# Patient Record
Sex: Male | Born: 1990 | Hispanic: No | Marital: Single | Smoking: Current every day smoker
Health system: Southern US, Community
[De-identification: ages and names within clinical notes are randomized; demographics above are authoritative.]

## PROBLEM LIST (undated history)

## (undated) DIAGNOSIS — D649 Anemia, unspecified: Secondary | ICD-10-CM

## (undated) HISTORY — PX: NO PAST SURGERIES: SHX2092

---

## 2012-05-08 ENCOUNTER — Ambulatory Visit (INDEPENDENT_AMBULATORY_CARE_PROVIDER_SITE_OTHER): Payer: Managed Care, Other (non HMO) | Admitting: Family Medicine

## 2012-05-08 VITALS — BP 90/57 | HR 86 | Temp 98.5°F | Resp 16 | Ht 73.0 in | Wt 128.0 lb

## 2012-05-08 DIAGNOSIS — R059 Cough, unspecified: Secondary | ICD-10-CM

## 2012-05-08 DIAGNOSIS — B9789 Other viral agents as the cause of diseases classified elsewhere: Secondary | ICD-10-CM

## 2012-05-08 DIAGNOSIS — B349 Viral infection, unspecified: Secondary | ICD-10-CM

## 2012-05-08 DIAGNOSIS — R05 Cough: Secondary | ICD-10-CM

## 2012-05-08 DIAGNOSIS — K2970 Gastritis, unspecified, without bleeding: Secondary | ICD-10-CM

## 2012-05-08 LAB — POCT CBC
Granulocyte percent: 67.9 %G (ref 37–80)
MCH, POC: 27.9 pg (ref 27–31.2)
MCV: 87.1 fL (ref 80–97)
MID (cbc): 0.4 (ref 0–0.9)
MPV: 9.4 fL (ref 0–99.8)
POC LYMPH PERCENT: 22.9 %L (ref 10–50)
POC MID %: 9.2 %M (ref 0–12)
Platelet Count, POC: 269 10*3/uL (ref 142–424)
RBC: 5.23 M/uL (ref 4.69–6.13)
RDW, POC: 13.4 %

## 2012-05-08 MED ORDER — BENZONATATE 100 MG PO CAPS: ORAL_CAPSULE | ORAL | Status: DC

## 2012-05-08 MED ORDER — OMEPRAZOLE 40 MG PO CPDR: 40.0000 mg | DELAYED_RELEASE_CAPSULE | Freq: Every day | ORAL | Status: DC

## 2012-05-08 NOTE — Patient Instructions (Addendum)
Take the omeprazole one daily for reducing stomach acid  Take the benzonatate one or 2 tablets 3 times daily as needed for cough  Get some over-the-counter  Claritin D (loratadine D) and take one daily for her head congestion  Drink plenty of water  Avoid spicy, fried or fatty foods into your stomach calms down  Return if needed

## 2012-05-08 NOTE — Progress Notes (Signed)
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 He has had pain in the epigastrium and up into his substernal region. He vomited one time yesterday. He also has developed head congestion and rhinorrhea. He does smoke almost half pack a day. He is generally a healthy young man however.  Objective: TMs are normal. Throat not erythematous. Neck supple without significant nodes. Nose is not terribly congested right now. His chest is clear to auscultation. Heart regular without murmurs gallops or arrhythmias. He does have a cough.  Assessment: Probable viral syndrome with gastroenteritis and URI  Plan: CBC  Results for orders placed in visit on 05/08/12  POCT CBC      Result Value Range   WBC 4.7  4.6 - 10.2 K/uL   Lymph, poc 1.1  0.6 - 3.4   POC LYMPH PERCENT 22.9  10 - 50 %L   MID (cbc) 0.4  0 - 0.9   POC MID % 9.2  0 - 12 %M   POC Granulocyte 3.2  2 - 6.9   Granulocyte percent 67.9  37 - 80 %G   RBC 5.23  4.69 - 6.13 M/uL   Hemoglobin 14.6  14.1 - 18.1 g/dL   HCT, POC 16.1  09.6 - 53.7 %   MCV 87.1  80 - 97 fL   MCH, POC 27.9  27 - 31.2 pg   MCHC 32.0  31.8 - 35.4 g/dL   RDW, POC 04.5     Platelet Count, POC 269  142 - 424 K/uL   MPV 9.4  0 - 99.8 fL   Treat as a viral syndrome with omeprazole for the stomach, Tessalon for the cough, and loratadine for the drainage.

## 2012-06-12 ENCOUNTER — Ambulatory Visit (INDEPENDENT_AMBULATORY_CARE_PROVIDER_SITE_OTHER): Payer: Managed Care, Other (non HMO) | Admitting: Internal Medicine

## 2012-06-12 ENCOUNTER — Ambulatory Visit: Payer: Managed Care, Other (non HMO)

## 2012-06-12 VITALS — BP 100/57 | HR 89 | Temp 98.0°F | Resp 16 | Ht 68.0 in | Wt 125.8 lb

## 2012-06-12 DIAGNOSIS — M25569 Pain in unspecified knee: Secondary | ICD-10-CM

## 2012-06-12 DIAGNOSIS — M25562 Pain in left knee: Secondary | ICD-10-CM

## 2012-06-12 DIAGNOSIS — S86912A Strain of unspecified muscle(s) and tendon(s) at lower leg level, left leg, initial encounter: Secondary | ICD-10-CM

## 2012-06-12 DIAGNOSIS — IMO0002 Reserved for concepts with insufficient information to code with codable children: Secondary | ICD-10-CM

## 2012-06-12 MED ORDER — IBUPROFEN 600 MG PO TABS: 600.0000 mg | ORAL_TABLET | Freq: Three times a day (TID) | ORAL | Status: DC | PRN

## 2012-06-12 NOTE — Progress Notes (Signed)
  Subjective:    Patient ID: Calvin Wolf, male    DOB: 21-Aug-1990, 22 y.o.   MRN: [id number]  HPI Pt c/o of knee pain started yesterday after playing soccer and someone hit him in the knee. Blow struck lateral knee and has pain laterally Pain is lateral joint line. No swelling and no previous knee injury.   Review of Systems     Objective:   Physical Exam  Constitutional: He is oriented to person, place, and time. He appears well-developed and well-nourished.  Eyes: EOM are normal.  Pulmonary/Chest: Effort normal.  Musculoskeletal: Normal range of motion. He exhibits tenderness. He exhibits no edema.       Left knee: He exhibits bony tenderness. He exhibits normal range of motion, no swelling, no effusion, no ecchymosis, no deformity, no erythema, normal alignment, no LCL laxity, normal patellar mobility, normal meniscus and no MCL laxity. Tenderness found. Lateral joint line tenderness noted. No medial joint line and no patellar tendon tenderness noted.  Neurological: He is alert and oriented to person, place, and time. He has normal strength. No cranial nerve deficit or sensory deficit. He exhibits normal muscle tone. Coordination and gait abnormal.  Psychiatric: He has a normal mood and affect. His behavior is normal.     [facility] reading (PRIMARY) by  Dr Perrin Maltese normal xr       Assessment & Plan:  Sprain knee Hinged knee brace/RICE/Motrin 600mg /Crutches RTC 1 week

## 2012-06-12 NOTE — Patient Instructions (Signed)
Knee, Cartilage (Meniscus) Injury It is suspected that you have a torn cartilage (meniscus) in your knee. The menisci are made of tough cartilage, and fit between the surfaces of the thigh and leg bones. The menisci are "C"shaped and have a wedged profile. The wedged profile helps the stability of the joint by keeping the rounded femur surface from sliding off the flat tibial surface. The menisci are fed (nourished) by small blood vessels; but there is also a large area at the inner edge of the meniscus that does not have a good blood supply (avascular). This presents a problem when there is an injury to the meniscus, because areas without good blood supply heal poorly. As a result when there is a torn cartilage in the knee, surgery is often required to fix it. This is usually done with a surgical procedure less invasive than open surgery (arthroscopy). Some times open surgery of the knee is required if there is other damage. PURPOSE OF THE MENISCUS The medial meniscus rests on the medial tibial plateau. The tibia is the large bone in your lower leg (the shin bone). The medial tibial plateau is the upper end of the bone making up the inner part of your knee. The lateral meniscus serves the same purpose and is located on the outside of the knee. The menisci help to distribute your body weight across the knee joint; they act as shock absorbers. Without the meniscus present, the weight of your body would be unevenly applied to the bones in your legs (the femur and tibia). The femur is the large bone in your thigh. This uneven weight distribution would cause increased wear and tear on the cartilage lining the joint surfaces, leading to early damage (arthritis) of these areas. The presence of the menisci cartilage is necessary for a healthy knee. PURPOSE OF THE KNEE CARTILAGE The knee joint is made up of three bones: the thigh bone (femur), the shin bone (tibia), and the knee cap (patella). The surfaces of these  bones at the knee joint are covered with cartilage called articular cartilage. This smooth slippery surface allows the bones to slide against each other without causing bone damage. The meniscus sits between these cartilaginous surfaces of the bones. It distributes the weight evenly in the joints and helps with the stability of the joint (keeps the joint steady). HOME CARE INSTRUCTIONS  Use crutches and external braces as instructed.  Once home an ice pack applied to your injured knee may help with discomfort and keep the swelling down. An ice pack can be used for the first couple of days or as instructed.  Only take over-the-counter or prescription medicines for pain, discomfort, or fever as directed by your caregiver.  Call if you do not have relief of pain with medications or if there is increasing in pain.  Call if your foot becomes cold or blue.  You may resume normal diet and activities as directed.  Make sure to keep your appointment with your follow-up caregiver. This injury may require further evaluation and treatment beyond the temporary treatment given today. Document Released: 03/14/2002 Document Revised: 03/16/2011 Document Reviewed: 07/06/2008 Exit[employer] Patient Information 2014 [employer].  

## 2012-06-12 NOTE — Progress Notes (Signed)
  Subjective:    Patient ID: Calvin Wolf, male    DOB: 1990/06/02, 22 y.o.   MRN: [id number]  HPI    Review of Systems     Objective:   Physical Exam        Assessment & Plan:

## 2012-07-07 ENCOUNTER — Ambulatory Visit (INDEPENDENT_AMBULATORY_CARE_PROVIDER_SITE_OTHER): Payer: Managed Care, Other (non HMO) | Admitting: Internal Medicine

## 2012-07-07 VITALS — BP 110/60 | HR 64 | Temp 98.0°F | Resp 16 | Ht 68.5 in | Wt 128.0 lb

## 2012-07-07 DIAGNOSIS — S81002D Unspecified open wound, left knee, subsequent encounter: Secondary | ICD-10-CM

## 2012-07-07 DIAGNOSIS — IMO0002 Reserved for concepts with insufficient information to code with codable children: Secondary | ICD-10-CM

## 2012-07-07 DIAGNOSIS — M25569 Pain in unspecified knee: Secondary | ICD-10-CM

## 2012-07-07 NOTE — Progress Notes (Signed)
  Subjective:    Patient ID: Calvin Wolf, male    DOB: [birth year], 22 y.o.   MRN: [id number]  HPI  Left without being seen  Review of Systems     Objective:   Physical Exam        Assessment & Plan:  No visit

## 2012-07-11 ENCOUNTER — Inpatient Hospital Stay (HOSPITAL_COMMUNITY): Payer: Managed Care, Other (non HMO)

## 2012-07-11 ENCOUNTER — Encounter (HOSPITAL_COMMUNITY): Payer: Self-pay | Admitting: *Deleted

## 2012-07-11 ENCOUNTER — Other Ambulatory Visit: Payer: Self-pay | Admitting: Oncology

## 2012-07-11 ENCOUNTER — Inpatient Hospital Stay (HOSPITAL_COMMUNITY)
Admission: AD | Admit: 2012-07-11 | Discharge: 2012-07-16 | DRG: 804 | Disposition: A | Discharge status: Home / Self Care | Payer: Managed Care, Other (non HMO) | Source: Ambulatory Visit | Attending: Internal Medicine | Admitting: Internal Medicine

## 2012-07-11 ENCOUNTER — Ambulatory Visit (INDEPENDENT_AMBULATORY_CARE_PROVIDER_SITE_OTHER): Payer: Managed Care, Other (non HMO) | Admitting: Family Medicine

## 2012-07-11 VITALS — BP 90/55 | HR 99 | Temp 101.0°F | Resp 16 | Ht 68.0 in | Wt 124.0 lb

## 2012-07-11 DIAGNOSIS — Z609 Problem related to social environment, unspecified: Secondary | ICD-10-CM

## 2012-07-11 DIAGNOSIS — R509 Fever, unspecified: Secondary | ICD-10-CM

## 2012-07-11 DIAGNOSIS — D709 Neutropenia, unspecified: Secondary | ICD-10-CM | POA: Diagnosis present

## 2012-07-11 DIAGNOSIS — R590 Localized enlarged lymph nodes: Secondary | ICD-10-CM

## 2012-07-11 DIAGNOSIS — R221 Localized swelling, mass and lump, neck: Secondary | ICD-10-CM | POA: Diagnosis present

## 2012-07-11 DIAGNOSIS — Z603 Acculturation difficulty: Secondary | ICD-10-CM

## 2012-07-11 DIAGNOSIS — D6959 Other secondary thrombocytopenia: Secondary | ICD-10-CM | POA: Diagnosis present

## 2012-07-11 DIAGNOSIS — D72819 Decreased white blood cell count, unspecified: Secondary | ICD-10-CM

## 2012-07-11 DIAGNOSIS — I959 Hypotension, unspecified: Secondary | ICD-10-CM

## 2012-07-11 DIAGNOSIS — R220 Localized swelling, mass and lump, head: Secondary | ICD-10-CM | POA: Diagnosis present

## 2012-07-11 DIAGNOSIS — I889 Nonspecific lymphadenitis, unspecified: Principal | ICD-10-CM | POA: Diagnosis present

## 2012-07-11 DIAGNOSIS — R5081 Fever presenting with conditions classified elsewhere: Secondary | ICD-10-CM | POA: Diagnosis present

## 2012-07-11 DIAGNOSIS — R599 Enlarged lymph nodes, unspecified: Secondary | ICD-10-CM

## 2012-07-11 HISTORY — DX: Anemia, unspecified: D64.9

## 2012-07-11 LAB — COMPREHENSIVE METABOLIC PANEL
ALT: 16 U/L (ref 0–53)
AST: 24 U/L (ref 0–37)
Alkaline Phosphatase: 70 U/L (ref 39–117)
CO2: 29 mEq/L (ref 19–32)
Calcium: 9.1 mg/dL (ref 8.4–10.5)
GFR calc Af Amer: >90 mL/min (ref >90)
Glucose, Bld: 81 mg/dL (ref 70–99)
Potassium: 3.8 mEq/L (ref 3.5–5.1)
Sodium: 135 mEq/L (ref 135–145)
Total Protein: 7.2 g/dL (ref 6.0–8.3)

## 2012-07-11 LAB — CBC WITH DIFFERENTIAL/PLATELET
Basophils Absolute: 0 10*3/uL (ref 0.0–0.1)
Eosinophils Relative: 1 % (ref 0–5)
HCT: 40.3 % (ref 39.0–52.0)
Hemoglobin: 13.5 g/dL (ref 13.0–17.0)
Lymphocytes Relative: 44 % (ref 12–46)
Lymphs Abs: 0.7 10*3/uL (ref 0.7–4.0)
MCV: 83.6 fL (ref 78.0–100.0)
Monocytes Relative: 16 % — ABNORMAL HIGH (ref 3–12)
Neutro Abs: 0.5 10*3/uL — ABNORMAL LOW (ref 1.7–7.7)
RBC: 4.82 MIL/uL (ref 4.22–5.81)
WBC: 1.4 10*3/uL — CL (ref 4.0–10.5)

## 2012-07-11 LAB — URINALYSIS, ROUTINE W REFLEX MICROSCOPIC
Glucose, UA: NEGATIVE mg/dL
Hgb urine dipstick: NEGATIVE
Leukocytes, UA: NEGATIVE
Specific Gravity, Urine: 1.026 (ref 1.005–1.030)
pH: 5.5 (ref 5.0–8.0)

## 2012-07-11 LAB — POCT CBC
Granulocyte percent: 51.6 %G (ref 37–80)
HCT, POC: 45.8 % (ref 43.5–53.7)
Hemoglobin: 14.5 g/dL (ref 14.1–18.1)
MCV: 90 fL (ref 80–97)
POC Granulocyte: 0.9 — AB (ref 2–6.9)

## 2012-07-11 LAB — LACTIC ACID, PLASMA: Lactic Acid, Venous: 0.8 mmol/L (ref 0.5–2.2)

## 2012-07-11 LAB — SAVE SMEAR

## 2012-07-11 LAB — MONONUCLEOSIS SCREEN: Mono Screen: NEGATIVE

## 2012-07-11 MED ORDER — SODIUM CHLORIDE 0.9 % IV SOLN
INTRAVENOUS | Status: DC
  Administered 2012-07-11: 22:00:00 via INTRAVENOUS

## 2012-07-11 MED ORDER — IOHEXOL 300 MG/ML  SOLN
50.0000 mL | Freq: Once | INTRAMUSCULAR | Status: AC | PRN
  Administered 2012-07-11: 50 mL via ORAL

## 2012-07-11 MED ORDER — ACETAMINOPHEN 325 MG PO TABS: 650.0000 mg | ORAL_TABLET | Freq: Four times a day (QID) | ORAL | Status: DC | PRN

## 2012-07-11 MED ORDER — ACETAMINOPHEN 650 MG RE SUPP: 650.0000 mg | Freq: Four times a day (QID) | RECTAL | Status: DC | PRN

## 2012-07-11 MED ORDER — VANCOMYCIN HCL IN DEXTROSE 750-5 MG/150ML-% IV SOLN
750.0000 mg | Freq: Three times a day (TID) | INTRAVENOUS | Status: DC
  Administered 2012-07-11 – 2012-07-13 (×5): 750 mg via INTRAVENOUS
  Filled 2012-07-11 (×6): qty 150

## 2012-07-11 MED ORDER — IOHEXOL 300 MG/ML  SOLN
100.0000 mL | Freq: Once | INTRAMUSCULAR | Status: AC | PRN
  Administered 2012-07-11: 100 mL via INTRAVENOUS

## 2012-07-11 MED ORDER — PIPERACILLIN-TAZOBACTAM 3.375 G IVPB
3.3750 g | Freq: Three times a day (TID) | INTRAVENOUS | Status: DC
  Administered 2012-07-11 – 2012-07-15 (×13): 3.375 g via INTRAVENOUS
  Filled 2012-07-11 (×16): qty 50

## 2012-07-11 NOTE — Progress Notes (Signed)
I was called today by Dr. Dallas Schimke regarding this young man presenting with fever, cervical adenopathy, and mild neutropenia. I suggested admission for evaluation of fever and neutropenia and possible malignancy. We will be glad to consult.

## 2012-07-11 NOTE — H&P (Signed)
  Chief Complaint:  Neck pain  HPI: 22 yo healthy male [name] for about one year lives with 3 other roommates presented to outpatient pcp today for mass to right neck for about 3 weeks.  States the mass started 3 weeks ago, was big when he first noticed it but over the last several days it has become more painful.  No drainage from lump but is quite painful to touch.  Had chills yesterday and noted to be febrile today in office with wbc of 1.7.  Patient denies any n /v/d.  No rashes.  No sore throat.  No recent uri or other gi illnesses.  No dysuria,  No penile rashes or discharge.  His last sexual activity was about 2 months ago.  No weight loss.  No cp, no sob, no cough.  No le edema or swelling.  No abd pain.  Denies ivda.  Review of Systems:  Positive and negative as per HPI otherwise all other systems are negative  Past Medical History: Past Medical History  Diagnosis Date  . Anemia age 8    Pt states "I had before anemia"   Past Surgical History  Procedure Laterality Date  . No past surgeries      Medications: Prior to Admission medications   Not on File  none  Allergies:  No Known Allergies  Social History:  reports that he has been smoking.  He has never used smokeless tobacco. He reports that  drinks alcohol. He reports that he does not use illicit drugs.  Last sexual activity 2 months ago.  Family History: History reviewed. No pertinent family history.  Physical Exam: There were no vitals filed for this visit. pending General appearance: alert, cooperative and no distress Head: Normocephalic, without obvious abnormality, atraumatic Eyes: negative Nose: Nares normal. Septum midline. Mucosa normal. No drainage or sinus tenderness. Neck: no JVD, supple, symmetrical, trachea midline and right posterior cervical lad 3cm at least painful to touch, no induration or flunctuance mobile Lungs: clear to auscultation bilaterally Heart: regular rate and  rhythm, S1, S2 normal, no murmur, click, rub or gallop Abdomen: soft, non-tender; bowel sounds normal; no masses,  no organomegaly Extremities: extremities normal, atraumatic, no cyanosis or edema Pulses: 2+ and symmetric Skin: Skin color, texture, turgor normal. No rashes or lesions Neurologic: Grossly normal   Labs on Admission:  pending    Recent Labs  07/11/12 1610  WBC 1.7*  HGB 14.5  HCT 45.8  MCV 90.0   Radiological Exams on Admission: Pending  Assessment/Plan  22 yo male with neutropenic fever and cervical lymphadenopathy Principal Problem:   Neutropenic fever Active Problems:   Lump in neck   Immigrant with language difficulty  Place on neutropenic precautions.  Oncology consulted and have spoken to them about case.  Ordered peripheral blood smear for their review in the lab tonight.  Order cmp, cbc with diff, hiv, lactic acid, ldh, mono screen, cxr, ua and ct scan of neck to evaluate mass.  Infectious source vs malignancy.  Place in stepdown for the night.  Vss.  ???childhood vaccination status needs to be clarified.  Pt speaks pretty fluent english and understood our conversation.  Calvin Wolf A 07/11/2012, 8:20 PM

## 2012-07-11 NOTE — Consult Note (Addendum)
Reason for Consult: febrile Neutropenia, swollen lymph node Referring Physician: Dr. Annie Paras is an 22 y.o. male.  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  His native language is arabic but he currently studies English at a local institution and he was able to provide a history.  He reports that he was in his usual state of health until about 3 weeks ago when he noticed swelling on the right side of his neck that was mildly painful when he turned his head while asleep.  He denied any nightsweats, recent travels, sick contacts, weight lost or new medications.   This was the first time this has ever happened.  He reports that 2 days ago, the size of the right neck swelling increased along with worsening pain.  He denied difficulty swallowing.  He had a mild headache yesterday partially relieved with headache medications (unsure of the name).  Due to increase in its size and pain in the neck, he reported to the family medicine clinic where a complete blood count revealed a WBC of 1.7, plts of 137K.  His prior CBC in May was within normal limits.   He reports having a anemia at age 65-14 that did not require iron or oral medication supplementation.  He lives in an apartment with his four roommates who are all healthy.   He denies rashes and recent insect bites.    He does endorse mild occasional dizziness upon standing over the past few years.  He is not sexually active presently and he denies intravenous or snorting drug use. He denies cough or dysuria.    Past Medical History  Diagnosis Date  . Anemia age 24    Pt states "I had before anemia"    Past Surgical History  Procedure Laterality Date  . No past surgeries      History reviewed. No pertinent family history.  Social History:  reports  that he has been smoking.  He has never used smokeless tobacco. He reports that  drinks alcohol. He reports that he does not use illicit drugs.  Allergies: No Known Allergies  Medications: I have reviewed the patient's current medications.  Results for orders placed in visit on 07/11/12 (from the past 48 hour(s))  POCT CBC     Status: Abnormal   Collection Time    07/11/12  4:10 PM      Result Value Range   WBC 1.7 (*) 4.6 - 10.2 K/uL   Lymph, poc 0.7  0.6 - 3.4   POC LYMPH PERCENT 38.7  10 - 50 %L   MID (cbc) 0.2  0 - 0.9   POC MID % 9.7  0 - 12 %M   POC Granulocyte 0.9 (*) 2 - 6.9   Granulocyte percent 51.6  37 - 80 %G   RBC 5.09  4.69 - 6.13 M/uL   Hemoglobin 14.5  14.1 - 18.1 g/dL   HCT, POC 16.1  09.6 - 53.7 %   MCV 90.0  80 - 97 fL   MCH, POC 28.5  27 - 31.2 pg   MCHC 31.7 (*) 31.8 - 35.4 g/dL   RDW, POC 04.5     Platelet Count, POC 129 (*) 142 - 424 K/uL   MPV 9.4  0 - 99.8 fL  POCT RAPID STREP A (OFFICE)  Status: None   Collection Time    07/11/12  4:43 PM      Result Value Range   Rapid Strep A Screen Negative  Negative   CBC    Component Value Date/Time   WBC 1.4* 07/11/2012 2010   WBC 1.7* 07/11/2012 1610   RBC 4.82 07/11/2012 2010   RBC 5.09 07/11/2012 1610   HGB 13.5 07/11/2012 2010   HGB 14.5 07/11/2012 1610   HCT 40.3 07/11/2012 2010   HCT 45.8 07/11/2012 1610   PLT 124* 07/11/2012 2010   MCV 83.6 07/11/2012 2010   MCV 90.0 07/11/2012 1610   MCH 28.0 07/11/2012 2010   MCH 28.5 07/11/2012 1610   MCHC 33.5 07/11/2012 2010   MCHC 31.7* 07/11/2012 1610   RDW 12.6 07/11/2012 2010   LYMPHSABS PENDING 07/11/2012 2010   MONOABS PENDING 07/11/2012 2010   EOSABS PENDING 07/11/2012 2010   BASOSABS PENDING 07/11/2012 2010    No results found.  Review of Systems  Constitutional: Positive for fever. Negative for chills, weight loss, malaise/fatigue and diaphoresis.  HENT: Negative for nosebleeds, congestion and sore throat.   Eyes: Negative for blurred vision and photophobia.   Respiratory: Negative for cough, hemoptysis, shortness of breath and stridor.   Cardiovascular: Negative for chest pain and palpitations.  Gastrointestinal: Negative for nausea, vomiting, abdominal pain, diarrhea and constipation.  Genitourinary: Negative for dysuria and hematuria.  Skin: Negative for rash.  Neurological: Positive for headaches. Negative for tingling, focal weakness and weakness.  Endo/Heme/Allergies: Does not bruise/bleed easily.  Psychiatric/Behavioral: Negative for substance abuse.   There were no vitals taken for this visit. Physical Exam  Constitutional: He is oriented to person, place, and time. He appears well-developed and well-nourished. He appears distressed.  HENT:  Head: Normocephalic and atraumatic.  Mouth/Throat: Oropharynx is clear and moist. No oropharyngeal exudate.  Eyes: Conjunctivae and EOM are normal. Pupils are equal, round, and reactive to light. No scleral icterus.  Neck: Neck supple.  R cervical node about 1 cm x 2 cm, TTP.  Cardiovascular: Normal rate, regular rhythm and normal heart sounds.  Exam reveals no gallop and no friction rub.   No murmur heard. Respiratory: Effort normal and breath sounds normal. He has no wheezes. He has no rales.  GI: Soft. Bowel sounds are normal. He exhibits no distension. There is tenderness. There is no rebound and no guarding.  No hepatamegaly or splenomegaly palpated.   Musculoskeletal: He exhibits no edema.  Neurological: He is alert and oriented to person, place, and time.  Skin: Skin is dry. No rash noted.    Assessment/Plan: 22 year of with acute lymphadenopathy, neutropenic fever and mild thrombocytopenia.   Differential diagnosis includes infection (i.e., abscess) versus malignancy (lymphoma) or autoimmune. Last normal CBC in May.   RECOMMENDATIONS. 1. Please complete infectious disease work-up including UA/Urine culture, blood cultures x2, viral studies.  Obtain imaging CT of  neck/chest/abdomen/pelvis.  Would start empiric antimicrobial therapy Zosyn/Vancomycin secondary to severe neutropenia in the setting of fever with implementation of neutropenic precautions following receipt of blood cultures.   Obtain CBC with differential, peripheral blood smear for evaluation by [place],  and comprehensive metabolic panel and ldh.   We will follow this patient along with you.  Plan discussed with Dr. Darnelle Catalan. Deepa Barthel 07/11/2012, 8:04 PM    Addedum: Blood Smear reviewed revealing decreased leukocytes with slight increase in bands, occasional LGL without blasts.  Apropriate appearance and morphology of red blood cells, with a zone of central pallor about  1/3.  And slight decrease in platlets with unremarkable morphology.

## 2012-07-11 NOTE — Progress Notes (Signed)
[facility] [address], [place] [address] [phone]  Date:  07/11/2012   Name:  Calvin Wolf   DOB:  [birth year]   MRN:  [id number]  PCP:  No primary provider on file.    Chief Complaint: Neck Pain, Sore Throat and Generalized Body Aches   History of Present Illness:  Calvin Wolf is a 22 y.o. very pleasant male patient who presents with the following:  Generally healthy young man.  Here today with concern about illness.  He notes a swelling and pain in the right side of his neck for the last couple of weeks.  He notes a mild ST, but it does not hurt to swallow.  He feels that he can move something around in the right side of his neck (suspect a node).   He has noted a subjective fever at home, but has not checked his temperature.  He does not have a runny nose, sneezing or coughing.   He is not eating during the day right now due to Ramadan, but is able to eat at night ok.  Denies weight loss.   There are no active problems to display for this patient.   No past medical history on file.  No past surgical history on file.  History  Substance Use Topics  . Smoking status: Current Every Day Smoker  . Smokeless tobacco: Not on file  . Alcohol Use: Yes    No family history on file.  No Known Allergies  Medication list has been reviewed and updated.  Current Outpatient Prescriptions on File Prior to Visit  Medication Sig Dispense Refill  . benzonatate (TESSALON) 100 MG capsule Use 1-2 tablets 3 times daily as necessary for cough. May be used with other cough medicines if needed.  30 capsule  0  . ibuprofen (ADVIL,MOTRIN) 600 MG tablet Take 1 tablet (600 mg total) by mouth every 8 (eight) hours as needed for pain.  30 tablet  0  . omeprazole (PRILOSEC) 40 MG capsule Take 1 capsule (40 mg total) by mouth daily.  30 capsule  3   No current facility-administered medications on file prior to visit.    Review of Systems:  As per HPI-  otherwise negative.   Physical Examination: Filed Vitals:   07/11/12 1519  BP: 90/55  Pulse: 99  Temp: 100.4 F (38 C)  Resp: 16    Body mass index is 18.86 kg/(m^2). Ideal Body Weight: Weight in (lb) to have BMI = 25: 164.1  GEN: WDWN, NAD, Non-toxic, A & O x 3, slim build HEENT: Atraumatic, Normocephalic. Neck supple.  Bilateral TM wnl, oropharynx normal.  PEERL,EOMI.   He has a tender node in the right side of his neck, approx 1X 2 cm in size.   Ears and Nose: No external deformity. CV: RRR, No M/G/R. No JVD. No thrill. No extra heart sounds. PULM: CTA B, no wheezes, crackles, rhonchi. No retractions. No resp. distress. No accessory muscle use. ABD: S, NT, ND, +BS. No rebound. No HSM. EXTR: No c/c/e, no rashes NEURO Normal gait.  PSYCH: Normally interactive. Conversant. Not depressed or anxious appearing.  Calm demeanor.   Results for orders placed in visit on 07/11/12  POCT CBC      Result Value Range   WBC 1.7 (*) 4.6 - 10.2 K/uL   Lymph, poc 0.7  0.6 - 3.4   POC LYMPH PERCENT 38.7  10 - 50 %L   MID (cbc) 0.2  0 -  0.9   POC MID % 9.7  0 - 12 %M   POC Granulocyte 0.9 (*) 2 - 6.9   Granulocyte percent 51.6  37 - 80 %G   RBC 5.09  4.69 - 6.13 M/uL   Hemoglobin 14.5  14.1 - 18.1 g/dL   HCT, POC 16.1  09.6 - 53.7 %   MCV 90.0  80 - 97 fL   MCH, POC 28.5  27 - 31.2 pg   MCHC 31.7 (*) 31.8 - 35.4 g/dL   RDW, POC 04.5     Platelet Count, POC 129 (*) 142 - 424 K/uL   MPV 9.4  0 - 99.8 fL  POCT RAPID STREP A (OFFICE)      Result Value Range   Rapid Strep A Screen Negative  Negative     Assessment and Plan: Fever, unspecified - Plan: POCT CBC, POCT rapid strep A  Lymphadenopathy of right cervical region - Plan: CANCELED: Epstein-Barr virus VCA antibody panel, CANCELED: HIV antibody, CANCELED: Pathologist smear review, CANCELED: Comprehensive metabolic panel  Leukopenia - Plan: Ambulatory referral to Hematology   Neutropenic fever.  Discussed with Dr. Darnelle Catalan, on  call for Heme- Onc.  Clinical picture is concerning for leukemia or lymphoma.  Discussed outpt vs inpatient work- up.  The patient is willing to proceed with inpt evaluation.    Discussed with [facility] on call who kindly agreed to admit pt. Discussed EMS transport, but pt would prefer private vehicle which is reasonable.  A friend will drive him.  Placed a mask on him and instructed him to wear it.    Of note I cancelled all other send out labs as it will be faster to have them done in- house than through [place].    Signed Abbe Amsterdam, MD

## 2012-07-11 NOTE — Patient Instructions (Addendum)
I am concerned about your low white blood cell count.  The area in your neck likely coresponds to a lymph node.  You may have a problem with your blood or lymph nodes.    Please proceed straight to [facility].  You will be admitted for further evaluation and treatment.   Go through the ER to register, and you will be send up to your room.  You will be a direct admission.

## 2012-07-11 NOTE — Progress Notes (Signed)
ANTIBIOTIC CONSULT NOTE - INITIAL  Pharmacy Consult for vancomycin/Zosyn Indication: neutropenic fever  No Known Allergies  Patient Measurements: Height: 5\' 8"  (172.7 cm) Weight: 124 lb (56.246 kg) IBW/kg (Calculated) : 68.4  Vital Signs: Temp: 101 F (38.3 C) (07/07 1630) BP: 90/55 mmHg (07/07 1519) Pulse Rate: 99 (07/07 1519) Intake/Output from previous day:   Intake/Output from this shift:    Labs:  Recent Labs  07/11/12 1610 07/11/12 2010 07/11/12 2011  WBC 1.7* 1.4*  --   HGB 14.5 13.5  --   PLT  --  124*  --   CREATININE  --   --  0.82   Estimated Creatinine Clearance: 114.2 ml/min (by C-G formula based on Cr of 0.82). No results found for this basename: VANCOTROUGH, VANCOPEAK, VANCORANDOM, GENTTROUGH, GENTPEAK, GENTRANDOM, TOBRATROUGH, TOBRAPEAK, TOBRARND, AMIKACINPEAK, AMIKACINTROU, AMIKACIN,  in the last 72 hours   Microbiology: No results found for this or any previous visit (from the past 720 hour(s)).  Medical History: Past Medical History  Diagnosis Date  . Anemia age 58    Pt states "I had before anemia"    Medications:  Scheduled:   Infusions:  . sodium chloride     Assessment: 22 yo male from [place] presented to ER with neck pain and a mass that started 3 weeks ago and has gotten progressively worse and painful. Patient also with fever (Tmax 101) and neutropenia to start vancomycin and Zosyn per pharmacy dosing for infectious source vs malignancy at this point  Goal of Therapy:  Vancomycin trough level 15-20 mcg/ml  Plan:  1) Vancomycin 750mg  IV q8 2) Zosyn 3.375g IV q8 (extended interval infusion)   Hessie Knows, PharmD, BCPS Pager [phone] 07/11/2012 9:11 PM

## 2012-07-12 ENCOUNTER — Telehealth: Payer: Self-pay | Admitting: *Deleted

## 2012-07-12 DIAGNOSIS — I959 Hypotension, unspecified: Secondary | ICD-10-CM

## 2012-07-12 DIAGNOSIS — R599 Enlarged lymph nodes, unspecified: Secondary | ICD-10-CM

## 2012-07-12 LAB — RPR: RPR Ser Ql: NONREACTIVE

## 2012-07-12 MED ORDER — DOXYCYCLINE HYCLATE 100 MG IV SOLR
100.0000 mg | Freq: Two times a day (BID) | INTRAVENOUS | Status: DC
  Administered 2012-07-12 – 2012-07-15 (×7): 100 mg via INTRAVENOUS
  Filled 2012-07-12 (×9): qty 100

## 2012-07-12 MED ORDER — SODIUM CHLORIDE 0.9 % IV SOLN
INTRAVENOUS | Status: AC
  Administered 2012-07-12: 75 mL via INTRAVENOUS

## 2012-07-12 NOTE — Consult Note (Signed)
INFECTIOUS DISEASE CONSULT NOTE  Date of Admission:  07/11/2012  Date of Consult:  07/12/2012  Reason for Consult: Fever, Anemia Referring Physician: Gwenlyn Perking, [place]  Impression/Recommendation Fever Anemia LAN  Will check: HIV RNA, Ehrlichia, RMSF, RPR, malaria, Cat scratch, EBV, CMV, toxo, quantiferon/TB Await BX- please send for silver stain and AFB/fungal/routine Cx.  Will add doxy  Comment- very broad ddx in this pt- his age suggests HIV and acute HIV should be rules out with RNA testing (despite - Ab test). Outside of that there are multiple possible diseases (infectious and otherwise). Malaria would seem very unlikely given how long he has been out of endemic area.   Thank you so much for this interesting consult,   Johny Sax (pager) [phone] [email or web address]  Calvin Wolf is an 22 y.o. male.  HPI: 22 yo M with no PMHx comes to [facility] with 3 weeks of R neck swelling and 2-3 days of increasing pain. Denies fever at home. He came to ED and was found to have temp 101, WBC 1.4 (ANC 500). He underwent  CT neck, chest, abd, pelvis:Fairly extensive lymphadenopathy along the right cervical and  supraclavicular regions. Changes could represent lymphoma,  infectious or inflammatory process, or metastasis. Mild splenic enlargement.  He was started on vanco/zosyn.   No recent travel out of [place]- [place] 08[place]14[place]11 from [place] No sick exposures. No hx cough.  No change in wt.  No change in urination or BM.  No dysphagia, no oral ulcers.  Had headache 2 days ago, no change in vision.  No bug bites, no recent outdoor exposures.  Not sexually active. Denies genital sores.      Past Medical History  Diagnosis Date  . Anemia age 21    Pt states "I had before anemia"    Past Surgical History  Procedure Laterality Date  . No past surgeries       No Known Allergies  Medications:  Scheduled: . piperacillin-tazobactam (ZOSYN)  IV  3.375 g Intravenous Q8H  .  vancomycin  750 mg Intravenous Q8H    Total days of antibiotics : 2          Social History:  reports that he has been smoking.  He has never used smokeless tobacco. He reports that  drinks alcohol. He reports that he does not use illicit drugs.  History reviewed. No pertinent family history. Parents in good health.   Negative except R neck swelling. see extensive ROS in HPI  Blood pressure 98/44, pulse 64, temperature 98.9 F (37.2 C), temperature source Oral, resp. rate 20, height 5\' 8"  (1.727 m), weight 56.246 kg (124 lb), SpO2 98.00%. General appearance: alert, cooperative and no distress Eyes: negative findings: conjunctivae and sclerae normal and pupils equal, round, reactive to light and accomodation Throat: lips, mucosa, and tongue normal; teeth and gums normal Neck: swelling, induration, R neck. tender.  Lungs: clear to auscultation bilaterally Heart: regular rate and rhythm Abdomen: soft, non-tender; bowel sounds normal; no masses,  no organomegaly Male genitalia: normal Lymph nodes: Cervical adenopathy: on R, Axillary adenopathy: none and Supraclavicular adenopathy: none Neurologic: Grossly normal   Results for orders placed during the hospital encounter of 07/11/12 (from the past 48 hour(s))  CBC WITH DIFFERENTIAL     Status: Abnormal   Collection Time    07/11/12  8:10 PM      Result Value Range   WBC 1.4 (*) 4.0 - 10.5 K/uL   Comment: REPEATED TO VERIFY  CRITICAL RESULT CALLED TO, READ BACK BY AND VERIFIED WITH:     REEVES RN @ 2031 ON 07/11/2012 BY MCREYNOLDS,B   RBC 4.82  4.22 - 5.81 MIL/uL   Hemoglobin 13.5  13.0 - 17.0 g/dL   HCT 16.1  09.6 - 04.5 %   MCV 83.6  78.0 - 100.0 fL   MCH 28.0  26.0 - 34.0 pg   MCHC 33.5  30.0 - 36.0 g/dL   RDW 40.9  81.1 - 91.4 %   Platelets 124 (*) 150 - 400 K/uL   Neutrophils Relative % 38 (*) 43 - 77 %   Lymphocytes Relative 44  12 - 46 %   Monocytes Relative 16 (*) 3 - 12 %   Eosinophils Relative 1  0 - 5 %    Basophils Relative 1  0 - 1 %   Neutro Abs 0.5 (*) 1.7 - 7.7 K/uL   Lymphs Abs 0.7  0.7 - 4.0 K/uL   Monocytes Absolute 0.2  0.1 - 1.0 K/uL   Eosinophils Absolute 0.0  0.0 - 0.7 K/uL   Basophils Absolute 0.0  0.0 - 0.1 K/uL   WBC Morphology INCREASED BANDS (>20% BANDS)     Smear Review LARGE PLATELETS PRESENT    LACTATE DEHYDROGENASE     Status: None   Collection Time    07/11/12  8:10 PM      Result Value Range   LDH 230  94 - 250 U/L  HIV ANTIBODY (ROUTINE TESTING)     Status: None   Collection Time    07/11/12  8:10 PM      Result Value Range   HIV NON REACTIVE  NON REACTIVE  MONONUCLEOSIS SCREEN     Status: None   Collection Time    07/11/12  8:10 PM      Result Value Range   Mono Screen NEGATIVE  NEGATIVE  SAVE SMEAR     Status: None   Collection Time    07/11/12  8:11 PM      Result Value Range   Smear Review SMEAR STAINED AND AVAILABLE FOR REVIEW    COMPREHENSIVE METABOLIC PANEL     Status: None   Collection Time    07/11/12  8:11 PM      Result Value Range   Sodium 135  135 - 145 mEq/L   Potassium 3.8  3.5 - 5.1 mEq/L   Chloride 99  96 - 112 mEq/L   CO2 29  19 - 32 mEq/L   Glucose, Bld 81  70 - 99 mg/dL   BUN 10  6 - 23 mg/dL   Creatinine, Ser 7.82  0.50 - 1.35 mg/dL   Calcium 9.1  8.4 - 95.6 mg/dL   Total Protein 7.2  6.0 - 8.3 g/dL   Albumin 3.7  3.5 - 5.2 g/dL   AST 24  0 - 37 U/L   ALT 16  0 - 53 U/L   Alkaline Phosphatase 70  39 - 117 U/L   Total Bilirubin 0.3  0.3 - 1.2 mg/dL   GFR calc non Af Amer >90  >90 mL/min   GFR calc Af Amer >90  >90 mL/min   Comment:            The eGFR has been calculated     using the CKD EPI equation.     This calculation has not been     validated in all clinical     situations.  eGFR's persistently     <90 mL/min signify     possible Chronic Kidney Disease.  MRSA PCR SCREENING     Status: None   Collection Time    07/11/12  8:47 PM      Result Value Range   MRSA by PCR NEGATIVE  NEGATIVE   Comment:             The GeneXpert MRSA Assay (FDA     approved for NASAL specimens     only), is one component of a     comprehensive MRSA colonization     surveillance program. It is not     intended to diagnose MRSA     infection nor to guide or     monitor treatment for     MRSA infections.  LACTIC ACID, PLASMA     Status: None   Collection Time    07/11/12  9:15 PM      Result Value Range   Lactic Acid, Venous 0.8  0.5 - 2.2 mmol/L  URINALYSIS, ROUTINE W REFLEX MICROSCOPIC     Status: None   Collection Time    07/11/12 10:14 PM      Result Value Range   Color, Urine YELLOW  YELLOW   APPearance CLEAR  CLEAR   Specific Gravity, Urine 1.026  1.005 - 1.030   pH 5.5  5.0 - 8.0   Glucose, UA NEGATIVE  NEGATIVE mg/dL   Hgb urine dipstick NEGATIVE  NEGATIVE   Bilirubin Urine NEGATIVE  NEGATIVE   Ketones, ur NEGATIVE  NEGATIVE mg/dL   Protein, ur NEGATIVE  NEGATIVE mg/dL   Urobilinogen, UA 1.0  0.0 - 1.0 mg/dL   Nitrite NEGATIVE  NEGATIVE   Leukocytes, UA NEGATIVE  NEGATIVE   Comment: MICROSCOPIC NOT DONE ON URINES WITH NEGATIVE PROTEIN, BLOOD, LEUKOCYTES, NITRITE, OR GLUCOSE <1000 mg/dL.   No results found for this basename: sdes, specrequest, cult, reptstatus   Ct Soft Tissue Neck W Contrast  07/12/2012   *RADIOLOGY REPORT*  Clinical Data:  Fever and neutropenia.  Mass in the right side of the neck.  Mass is painful to touch.  CT NECK, CHEST, ABDOMEN AND PELVIS WITH CONTRAST  Technique:  Multidetector CT imaging of the neck, chest, abdomen and pelvis was performed using the standard protocol following the bolus administration of intravenous contrast.  Contrast: 50mL OMNIPAQUE IOHEXOL 300 MG/ML  SOLN, OMNIPAQUE IOHEXOL 300 MG/ML  SOLN  Comparison:   None.  CT NECK  Findings:  There is right-sided cervical and supraclavicular lymphadenopathy with enhancing lymph nodes demonstrated IIA, IIB, III, IV and V levels.  Cervical lymph nodes measure up to about 1.1 x 2.5 cm diameter.  Supraclavicular lymph  nodes measure up to about 0.9 x 1.4 cm.  No significant lymphadenopathy is demonstrated on the left.  Changes could be due to infectious or inflammatory process, lymphoma, or metastasis.  Tonsillar adenoidal tissues are not enlarged.  No mucosal space or prevertebral lesions demonstrated.  Salivary glands appear homogeneous and symmetrical. Carotid and jugular vessels are patent and not effaced.  Laryngeal structures appear intact.  Cervical airway appears patent.  Thyroid gland appears homogeneous.  Normal alignment of the cervical vertebrae.  No destructive bone lesions appreciated.  IMPRESSION: Fairly extensive lymphadenopathy along the right cervical and supraclavicular regions.  Changes could represent lymphoma, infectious or inflammatory process, or metastasis.  CT CHEST  Findings: Right-sided supraclavicular lymphadenopathy as previously discussed.  See above.  No significant axillary, mediastinal, or hilar lymphadenopathy  on either side.  Normal heart size.  Normal caliber thoracic aorta.  No mediastinal mass lesion.  Esophagus is decompressed.  Small calcified lymph node in the middle mediastinum to the right of the right mainstem bronchus.  No pleural effusions. Lungs appear clear.  No focal lung mass, consolidation, airspace disease, or interstitial infiltration.  Airways appear patent.  No pneumothorax.  Normal alignment of the thoracic spine.  No destructive bone lesions are appreciated.  IMPRESSION: Right supraclavicular lymphadenopathy.  Chest is otherwise negative.  CT ABDOMEN AND PELVIS  Findings: Spleen is mildly enlarged, measuring about 5.8 x 10.5 x 13.6 cm.  Normal homogeneous parenchymal echotexture.  The liver, gallbladder, pancreas, adrenal glands, kidneys, abdominal aorta, and inferior vena cava are unremarkable.  Mesenteric and retroperitoneal lymph nodes are present but without pathologic enlargement.  The stomach, small bowel, and colon are decompressed. No free air or free fluid in the  abdomen.  Abdominal wall musculature appears intact.  Pelvis:  The prostate gland is not enlarged.  Bladder wall is not thickened.  Stool filled rectosigmoid colon without diverticulitis. The appendix is normal.  No free or loculated pelvic fluid collections.  No significant lymphadenopathy in the pelvis or groin regions.  Visualized growing nodes are not pathologically enlarged. Normal alignment of the lumbar vertebra.  No destructive bone lesions appreciated.  IMPRESSION: Mild splenic enlargement.  No significant lymphadenopathy in the abdomen or pelvis.   Original Report Authenticated By: Burman Nieves, M.D.   Ct Chest W Contrast  07/12/2012   *RADIOLOGY REPORT*  Clinical Data:  Fever and neutropenia.  Mass in the right side of the neck.  Mass is painful to touch.  CT NECK, CHEST, ABDOMEN AND PELVIS WITH CONTRAST  Technique:  Multidetector CT imaging of the neck, chest, abdomen and pelvis was performed using the standard protocol following the bolus administration of intravenous contrast.  Contrast: 50mL OMNIPAQUE IOHEXOL 300 MG/ML  SOLN, OMNIPAQUE IOHEXOL 300 MG/ML  SOLN  Comparison:   None.  CT NECK  Findings:  There is right-sided cervical and supraclavicular lymphadenopathy with enhancing lymph nodes demonstrated IIA, IIB, III, IV and V levels.  Cervical lymph nodes measure up to about 1.1 x 2.5 cm diameter.  Supraclavicular lymph nodes measure up to about 0.9 x 1.4 cm.  No significant lymphadenopathy is demonstrated on the left.  Changes could be due to infectious or inflammatory process, lymphoma, or metastasis.  Tonsillar adenoidal tissues are not enlarged.  No mucosal space or prevertebral lesions demonstrated.  Salivary glands appear homogeneous and symmetrical. Carotid and jugular vessels are patent and not effaced.  Laryngeal structures appear intact.  Cervical airway appears patent.  Thyroid gland appears homogeneous.  Normal alignment of the cervical vertebrae.  No destructive bone lesions  appreciated.  IMPRESSION: Fairly extensive lymphadenopathy along the right cervical and supraclavicular regions.  Changes could represent lymphoma, infectious or inflammatory process, or metastasis.  CT CHEST  Findings: Right-sided supraclavicular lymphadenopathy as previously discussed.  See above.  No significant axillary, mediastinal, or hilar lymphadenopathy on either side.  Normal heart size.  Normal caliber thoracic aorta.  No mediastinal mass lesion.  Esophagus is decompressed.  Small calcified lymph node in the middle mediastinum to the right of the right mainstem bronchus.  No pleural effusions. Lungs appear clear.  No focal lung mass, consolidation, airspace disease, or interstitial infiltration.  Airways appear patent.  No pneumothorax.  Normal alignment of the thoracic spine.  No destructive bone lesions are appreciated.  IMPRESSION: Right supraclavicular lymphadenopathy.  Chest is otherwise negative.  CT ABDOMEN AND PELVIS  Findings: Spleen is mildly enlarged, measuring about 5.8 x 10.5 x 13.6 cm.  Normal homogeneous parenchymal echotexture.  The liver, gallbladder, pancreas, adrenal glands, kidneys, abdominal aorta, and inferior vena cava are unremarkable.  Mesenteric and retroperitoneal lymph nodes are present but without pathologic enlargement.  The stomach, small bowel, and colon are decompressed. No free air or free fluid in the abdomen.  Abdominal wall musculature appears intact.  Pelvis:  The prostate gland is not enlarged.  Bladder wall is not thickened.  Stool filled rectosigmoid colon without diverticulitis. The appendix is normal.  No free or loculated pelvic fluid collections.  No significant lymphadenopathy in the pelvis or groin regions.  Visualized growing nodes are not pathologically enlarged. Normal alignment of the lumbar vertebra.  No destructive bone lesions appreciated.  IMPRESSION: Mild splenic enlargement.  No significant lymphadenopathy in the abdomen or pelvis.   Original Report  Authenticated By: Burman Nieves, M.D.   Ct Abdomen Pelvis W Contrast  07/12/2012   *RADIOLOGY REPORT*  Clinical Data:  Fever and neutropenia.  Mass in the right side of the neck.  Mass is painful to touch.  CT NECK, CHEST, ABDOMEN AND PELVIS WITH CONTRAST  Technique:  Multidetector CT imaging of the neck, chest, abdomen and pelvis was performed using the standard protocol following the bolus administration of intravenous contrast.  Contrast: 50mL OMNIPAQUE IOHEXOL 300 MG/ML  SOLN, OMNIPAQUE IOHEXOL 300 MG/ML  SOLN  Comparison:   None.  CT NECK  Findings:  There is right-sided cervical and supraclavicular lymphadenopathy with enhancing lymph nodes demonstrated IIA, IIB, III, IV and V levels.  Cervical lymph nodes measure up to about 1.1 x 2.5 cm diameter.  Supraclavicular lymph nodes measure up to about 0.9 x 1.4 cm.  No significant lymphadenopathy is demonstrated on the left.  Changes could be due to infectious or inflammatory process, lymphoma, or metastasis.  Tonsillar adenoidal tissues are not enlarged.  No mucosal space or prevertebral lesions demonstrated.  Salivary glands appear homogeneous and symmetrical. Carotid and jugular vessels are patent and not effaced.  Laryngeal structures appear intact.  Cervical airway appears patent.  Thyroid gland appears homogeneous.  Normal alignment of the cervical vertebrae.  No destructive bone lesions appreciated.  IMPRESSION: Fairly extensive lymphadenopathy along the right cervical and supraclavicular regions.  Changes could represent lymphoma, infectious or inflammatory process, or metastasis.  CT CHEST  Findings: Right-sided supraclavicular lymphadenopathy as previously discussed.  See above.  No significant axillary, mediastinal, or hilar lymphadenopathy on either side.  Normal heart size.  Normal caliber thoracic aorta.  No mediastinal mass lesion.  Esophagus is decompressed.  Small calcified lymph node in the middle mediastinum to the right of the right  mainstem bronchus.  No pleural effusions. Lungs appear clear.  No focal lung mass, consolidation, airspace disease, or interstitial infiltration.  Airways appear patent.  No pneumothorax.  Normal alignment of the thoracic spine.  No destructive bone lesions are appreciated.  IMPRESSION: Right supraclavicular lymphadenopathy.  Chest is otherwise negative.  CT ABDOMEN AND PELVIS  Findings: Spleen is mildly enlarged, measuring about 5.8 x 10.5 x 13.6 cm.  Normal homogeneous parenchymal echotexture.  The liver, gallbladder, pancreas, adrenal glands, kidneys, abdominal aorta, and inferior vena cava are unremarkable.  Mesenteric and retroperitoneal lymph nodes are present but without pathologic enlargement.  The stomach, small bowel, and colon are decompressed. No free air or free fluid in the abdomen.  Abdominal wall musculature appears intact.  Pelvis:  The prostate gland is not enlarged.  Bladder wall is not thickened.  Stool filled rectosigmoid colon without diverticulitis. The appendix is normal.  No free or loculated pelvic fluid collections.  No significant lymphadenopathy in the pelvis or groin regions.  Visualized growing nodes are not pathologically enlarged. Normal alignment of the lumbar vertebra.  No destructive bone lesions appreciated.  IMPRESSION: Mild splenic enlargement.  No significant lymphadenopathy in the abdomen or pelvis.   Original Report Authenticated By: Burman Nieves, M.D.   Recent Results (from the past 240 hour(s))  MRSA PCR SCREENING     Status: None   Collection Time    07/11/12  8:47 PM      Result Value Range Status   MRSA by PCR NEGATIVE  NEGATIVE Final   Comment:            The GeneXpert MRSA Assay (FDA     approved for NASAL specimens     only), is one component of a     comprehensive MRSA colonization     surveillance program. It is not     intended to diagnose MRSA     infection nor to guide or     monitor treatment for     MRSA infections.      07/12/2012, 8:49  AM     LOS: 1 day

## 2012-07-12 NOTE — Progress Notes (Signed)
[facility] PROGRESS NOTE  Calvin Wolf OZH:[id number] DOB: 1990/11/17 DOA: 07/11/2012 PCP: Dr. Shanda Bumps Copland  Assessment/Plan: 1-Neutropenic fever: with enlarged lymph node. Concerns for infectious etiology vs malignancy. -will continue vanc, zosyn and doxycycline as recommended by ID -per hem/onc if no changes in the next 24-48 hours with antibiotic therapy and supportive care will need to performed excisional lymph node biopsy. -Checking HIV RNA, Ehrlichia, RMSF, RPR, malaria, Cat scratch, EBV, CMV, toxo, quantiferon/TB as recommended by ID  2-Thrombocytopenia: most likely secondary to infection/ongoing process. -will avoid heparin products and follow trend.  3-Soft BP: ?? Associated with infection vs soft BP given age and body habitus. Will continue IVF's and will VS  DVT:SCD's  Code Status: Full Family Communication: no family at bedside Disposition Plan: will observe for another 24 hours in stepdown unit, follow blood cultures and cultures recommended by ID   Consultants:  ID  Hem/Onc  Procedures:  See below for x-ray reports  Antibiotics:  Zenaida Niece  Zosyn  doxycycline  HPI/Subjective: Ongoing low grade fever, right cervical lymph node tender to palpation and enlarged. No CP, no SOB, no nausea, vomiting and/or abd pain.  Objective: Filed Vitals:   07/12/12 0600 07/12/12 0800 07/12/12 0900 07/12/12 1000  BP: 98/44  113/53 96/46  Pulse: 64 69 72 66  Temp:  98.9 F (37.2 C)    TempSrc:  Oral    Resp: 20 18 18 19   Height:      Weight:      SpO2: 98% 98% 100% 98%    Intake/Output Summary (Last 24 hours) at 07/12/12 1055 Last data filed at 07/12/12 0937  Gross per 24 hour  Intake   1930 ml  Output    950 ml  Net    980 ml   Filed Weights   07/11/12 2100  Weight: 56.246 kg (124 lb)    Exam:   General:  Warm to touch; per VS low grade fever; no HA's, no blurred vision and no CP or SOB.  Cardiovascular: S1 and S2, no rubs or  gallops  Respiratory: scattered rhonchi; no wheezing no rales  Abdomen: soft, NT, ND, positive BS  Musculoskeletal: no joint swelling and no erythema  Neck: with positive right side cervical lymph node, enlarged and tender to palpation.  Data Reviewed: Basic Metabolic Panel:  Recent Labs Lab 07/11/12 2011  NA 135  K 3.8  CL 99  CO2 29  GLUCOSE 81  BUN 10  CREATININE 0.82  CALCIUM 9.1   Liver Function Tests:  Recent Labs Lab 07/11/12 2011  AST 24  ALT 16  ALKPHOS 70  BILITOT 0.3  PROT 7.2  ALBUMIN 3.7   CBC:  Recent Labs Lab 07/11/12 1610 07/11/12 2010  WBC 1.7* 1.4*  NEUTROABS  --  0.5*  HGB 14.5 13.5  HCT 45.8 40.3  MCV 90.0 83.6  PLT  --  124*    Recent Results (from the past 240 hour(s))  MRSA PCR SCREENING     Status: None   Collection Time    07/11/12  8:47 PM      Result Value Range Status   MRSA by PCR NEGATIVE  NEGATIVE Final   Comment:            The GeneXpert MRSA Assay (FDA     approved for NASAL specimens     only), is one component of a     comprehensive MRSA colonization     surveillance program. It is not  intended to diagnose MRSA     infection nor to guide or     monitor treatment for     MRSA infections.     Studies: Ct Soft Tissue Neck W Contrast  07/12/2012   *RADIOLOGY REPORT*  Clinical Data:  Fever and neutropenia.  Mass in the right side of the neck.  Mass is painful to touch.  CT NECK, CHEST, ABDOMEN AND PELVIS WITH CONTRAST  Technique:  Multidetector CT imaging of the neck, chest, abdomen and pelvis was performed using the standard protocol following the bolus administration of intravenous contrast.  Contrast: 50mL OMNIPAQUE IOHEXOL 300 MG/ML  SOLN, OMNIPAQUE IOHEXOL 300 MG/ML  SOLN  Comparison:   None.  CT NECK  Findings:  There is right-sided cervical and supraclavicular lymphadenopathy with enhancing lymph nodes demonstrated IIA, IIB, III, IV and V levels.  Cervical lymph nodes measure up to about 1.1 x 2.5 cm  diameter.  Supraclavicular lymph nodes measure up to about 0.9 x 1.4 cm.  No significant lymphadenopathy is demonstrated on the left.  Changes could be due to infectious or inflammatory process, lymphoma, or metastasis.  Tonsillar adenoidal tissues are not enlarged.  No mucosal space or prevertebral lesions demonstrated.  Salivary glands appear homogeneous and symmetrical. Carotid and jugular vessels are patent and not effaced.  Laryngeal structures appear intact.  Cervical airway appears patent.  Thyroid gland appears homogeneous.  Normal alignment of the cervical vertebrae.  No destructive bone lesions appreciated.  IMPRESSION: Fairly extensive lymphadenopathy along the right cervical and supraclavicular regions.  Changes could represent lymphoma, infectious or inflammatory process, or metastasis.  CT CHEST  Findings: Right-sided supraclavicular lymphadenopathy as previously discussed.  See above.  No significant axillary, mediastinal, or hilar lymphadenopathy on either side.  Normal heart size.  Normal caliber thoracic aorta.  No mediastinal mass lesion.  Esophagus is decompressed.  Small calcified lymph node in the middle mediastinum to the right of the right mainstem bronchus.  No pleural effusions. Lungs appear clear.  No focal lung mass, consolidation, airspace disease, or interstitial infiltration.  Airways appear patent.  No pneumothorax.  Normal alignment of the thoracic spine.  No destructive bone lesions are appreciated.  IMPRESSION: Right supraclavicular lymphadenopathy.  Chest is otherwise negative.  CT ABDOMEN AND PELVIS  Findings: Spleen is mildly enlarged, measuring about 5.8 x 10.5 x 13.6 cm.  Normal homogeneous parenchymal echotexture.  The liver, gallbladder, pancreas, adrenal glands, kidneys, abdominal aorta, and inferior vena cava are unremarkable.  Mesenteric and retroperitoneal lymph nodes are present but without pathologic enlargement.  The stomach, small bowel, and colon are decompressed.  No free air or free fluid in the abdomen.  Abdominal wall musculature appears intact.  Pelvis:  The prostate gland is not enlarged.  Bladder wall is not thickened.  Stool filled rectosigmoid colon without diverticulitis. The appendix is normal.  No free or loculated pelvic fluid collections.  No significant lymphadenopathy in the pelvis or groin regions.  Visualized growing nodes are not pathologically enlarged. Normal alignment of the lumbar vertebra.  No destructive bone lesions appreciated.  IMPRESSION: Mild splenic enlargement.  No significant lymphadenopathy in the abdomen or pelvis.   Original Report Authenticated By: Burman Nieves, M.D.   Ct Chest W Contrast  07/12/2012   *RADIOLOGY REPORT*  Clinical Data:  Fever and neutropenia.  Mass in the right side of the neck.  Mass is painful to touch.  CT NECK, CHEST, ABDOMEN AND PELVIS WITH CONTRAST  Technique:  Multidetector CT imaging of the  neck, chest, abdomen and pelvis was performed using the standard protocol following the bolus administration of intravenous contrast.  Contrast: 50mL OMNIPAQUE IOHEXOL 300 MG/ML  SOLN, OMNIPAQUE IOHEXOL 300 MG/ML  SOLN  Comparison:   None.  CT NECK  Findings:  There is right-sided cervical and supraclavicular lymphadenopathy with enhancing lymph nodes demonstrated IIA, IIB, III, IV and V levels.  Cervical lymph nodes measure up to about 1.1 x 2.5 cm diameter.  Supraclavicular lymph nodes measure up to about 0.9 x 1.4 cm.  No significant lymphadenopathy is demonstrated on the left.  Changes could be due to infectious or inflammatory process, lymphoma, or metastasis.  Tonsillar adenoidal tissues are not enlarged.  No mucosal space or prevertebral lesions demonstrated.  Salivary glands appear homogeneous and symmetrical. Carotid and jugular vessels are patent and not effaced.  Laryngeal structures appear intact.  Cervical airway appears patent.  Thyroid gland appears homogeneous.  Normal alignment of the cervical  vertebrae.  No destructive bone lesions appreciated.  IMPRESSION: Fairly extensive lymphadenopathy along the right cervical and supraclavicular regions.  Changes could represent lymphoma, infectious or inflammatory process, or metastasis.  CT CHEST  Findings: Right-sided supraclavicular lymphadenopathy as previously discussed.  See above.  No significant axillary, mediastinal, or hilar lymphadenopathy on either side.  Normal heart size.  Normal caliber thoracic aorta.  No mediastinal mass lesion.  Esophagus is decompressed.  Small calcified lymph node in the middle mediastinum to the right of the right mainstem bronchus.  No pleural effusions. Lungs appear clear.  No focal lung mass, consolidation, airspace disease, or interstitial infiltration.  Airways appear patent.  No pneumothorax.  Normal alignment of the thoracic spine.  No destructive bone lesions are appreciated.  IMPRESSION: Right supraclavicular lymphadenopathy.  Chest is otherwise negative.  CT ABDOMEN AND PELVIS  Findings: Spleen is mildly enlarged, measuring about 5.8 x 10.5 x 13.6 cm.  Normal homogeneous parenchymal echotexture.  The liver, gallbladder, pancreas, adrenal glands, kidneys, abdominal aorta, and inferior vena cava are unremarkable.  Mesenteric and retroperitoneal lymph nodes are present but without pathologic enlargement.  The stomach, small bowel, and colon are decompressed. No free air or free fluid in the abdomen.  Abdominal wall musculature appears intact.  Pelvis:  The prostate gland is not enlarged.  Bladder wall is not thickened.  Stool filled rectosigmoid colon without diverticulitis. The appendix is normal.  No free or loculated pelvic fluid collections.  No significant lymphadenopathy in the pelvis or groin regions.  Visualized growing nodes are not pathologically enlarged. Normal alignment of the lumbar vertebra.  No destructive bone lesions appreciated.  IMPRESSION: Mild splenic enlargement.  No significant lymphadenopathy in  the abdomen or pelvis.   Original Report Authenticated By: Burman Nieves, M.D.   Ct Abdomen Pelvis W Contrast  07/12/2012   *RADIOLOGY REPORT*  Clinical Data:  Fever and neutropenia.  Mass in the right side of the neck.  Mass is painful to touch.  CT NECK, CHEST, ABDOMEN AND PELVIS WITH CONTRAST  Technique:  Multidetector CT imaging of the neck, chest, abdomen and pelvis was performed using the standard protocol following the bolus administration of intravenous contrast.  Contrast: 50mL OMNIPAQUE IOHEXOL 300 MG/ML  SOLN, OMNIPAQUE IOHEXOL 300 MG/ML  SOLN  Comparison:   None.  CT NECK  Findings:  There is right-sided cervical and supraclavicular lymphadenopathy with enhancing lymph nodes demonstrated IIA, IIB, III, IV and V levels.  Cervical lymph nodes measure up to about 1.1 x 2.5 cm diameter.  Supraclavicular lymph nodes  measure up to about 0.9 x 1.4 cm.  No significant lymphadenopathy is demonstrated on the left.  Changes could be due to infectious or inflammatory process, lymphoma, or metastasis.  Tonsillar adenoidal tissues are not enlarged.  No mucosal space or prevertebral lesions demonstrated.  Salivary glands appear homogeneous and symmetrical. Carotid and jugular vessels are patent and not effaced.  Laryngeal structures appear intact.  Cervical airway appears patent.  Thyroid gland appears homogeneous.  Normal alignment of the cervical vertebrae.  No destructive bone lesions appreciated.  IMPRESSION: Fairly extensive lymphadenopathy along the right cervical and supraclavicular regions.  Changes could represent lymphoma, infectious or inflammatory process, or metastasis.  CT CHEST  Findings: Right-sided supraclavicular lymphadenopathy as previously discussed.  See above.  No significant axillary, mediastinal, or hilar lymphadenopathy on either side.  Normal heart size.  Normal caliber thoracic aorta.  No mediastinal mass lesion.  Esophagus is decompressed.  Small calcified lymph node in the middle  mediastinum to the right of the right mainstem bronchus.  No pleural effusions. Lungs appear clear.  No focal lung mass, consolidation, airspace disease, or interstitial infiltration.  Airways appear patent.  No pneumothorax.  Normal alignment of the thoracic spine.  No destructive bone lesions are appreciated.  IMPRESSION: Right supraclavicular lymphadenopathy.  Chest is otherwise negative.  CT ABDOMEN AND PELVIS  Findings: Spleen is mildly enlarged, measuring about 5.8 x 10.5 x 13.6 cm.  Normal homogeneous parenchymal echotexture.  The liver, gallbladder, pancreas, adrenal glands, kidneys, abdominal aorta, and inferior vena cava are unremarkable.  Mesenteric and retroperitoneal lymph nodes are present but without pathologic enlargement.  The stomach, small bowel, and colon are decompressed. No free air or free fluid in the abdomen.  Abdominal wall musculature appears intact.  Pelvis:  The prostate gland is not enlarged.  Bladder wall is not thickened.  Stool filled rectosigmoid colon without diverticulitis. The appendix is normal.  No free or loculated pelvic fluid collections.  No significant lymphadenopathy in the pelvis or groin regions.  Visualized growing nodes are not pathologically enlarged. Normal alignment of the lumbar vertebra.  No destructive bone lesions appreciated.  IMPRESSION: Mild splenic enlargement.  No significant lymphadenopathy in the abdomen or pelvis.   Original Report Authenticated By: Burman Nieves, M.D.    Scheduled Meds: . doxycycline (VIBRAMYCIN) IV  100 mg Intravenous Q12H  . piperacillin-tazobactam (ZOSYN)  IV  3.375 g Intravenous Q8H  . vancomycin  750 mg Intravenous Q8H   Continuous Infusions: . sodium chloride      Principal Problem:   Neutropenic fever Active Problems:   Lump in neck   Immigrant with language difficulty    Time spent: >30 minutes   Arlyn Buerkle  [facility] Pager [phone]. If 7PM-7AM, please contact night-coverage at  [email or web address], password [facility] 07/12/2012, 10:55 AM  LOS: 1 day

## 2012-07-12 NOTE — Consult Note (Signed)
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 There is mild splenomegaly by CT scan, but no other evidence of disease. Review of the blood film does not show acute leukemia, and HIV and mono studies are negative. Urine is unremarkable. Blood cultures are pending.  I am concerned we may be dealing with Hodgkin's lymphoma. This may require an excisional biopsy as opposed to a fine needle biopsy to demonstrate. Alternatively this could be an atypical infection. At this point I would suggest:  ID consult, so that if fine needle biopsy is done maximum information can be obtained  Observation under treatment another 24-48 hours. If there is obvious response to antibiotic therapy biopsy may be obviated.  Will follow with you to definitive diagnosis and treatment plan. Appreciate consult.

## 2012-07-12 NOTE — Telephone Encounter (Signed)
sw pt gv appt for 07/19/12 with labs@ 2pm and ov @2 :30pm. i also made the pt aware that cs will give him a call w/ appt d/t for the PET scan. i also mailed a letter/avs as well...td

## 2012-07-13 ENCOUNTER — Other Ambulatory Visit: Payer: Self-pay | Admitting: Oncology

## 2012-07-13 ENCOUNTER — Encounter (HOSPITAL_COMMUNITY): Payer: Self-pay | Admitting: Radiology

## 2012-07-13 ENCOUNTER — Telehealth: Payer: Self-pay | Admitting: *Deleted

## 2012-07-13 LAB — CBC WITH DIFFERENTIAL/PLATELET
Basophils Relative: 1 % (ref 0–1)
Eosinophils Absolute: 0.1 10*3/uL (ref 0.0–0.7)
Eosinophils Relative: 3 % (ref 0–5)
HCT: 38.8 % — ABNORMAL LOW (ref 39.0–52.0)
Hemoglobin: 13.1 g/dL (ref 13.0–17.0)
MCH: 28.4 pg (ref 26.0–34.0)
MCHC: 33.8 g/dL (ref 30.0–36.0)
Monocytes Absolute: 0.4 10*3/uL (ref 0.1–1.0)
Neutro Abs: 0.5 10*3/uL — ABNORMAL LOW (ref 1.7–7.7)
RBC: 4.62 MIL/uL (ref 4.22–5.81)

## 2012-07-13 LAB — COMPREHENSIVE METABOLIC PANEL
Alkaline Phosphatase: 67 U/L (ref 39–117)
BUN: 7 mg/dL (ref 6–23)
CO2: 29 mEq/L (ref 19–32)
GFR calc Af Amer: >90 mL/min (ref >90)
GFR calc non Af Amer: >90 mL/min (ref >90)
Glucose, Bld: 99 mg/dL (ref 70–99)
Potassium: 3.5 mEq/L (ref 3.5–5.1)
Total Protein: 7.3 g/dL (ref 6.0–8.3)

## 2012-07-13 LAB — TSH: TSH: 2.843 u[IU]/mL (ref 0.350–4.500)

## 2012-07-13 LAB — VANCOMYCIN, TROUGH: Vancomycin Tr: 9.7 ug/mL — ABNORMAL LOW (ref 10.0–20.0)

## 2012-07-13 LAB — EBV AB TO VIRAL CAPSID AG PNL, IGG+IGM: EBV VCA IgG: >750 U/mL — ABNORMAL HIGH (ref <18.0)

## 2012-07-13 LAB — HIV-1 RNA QUANT-NO REFLEX-BLD
HIV 1 RNA Quant: <20 copies/mL (ref <20)
HIV-1 RNA Quant, Log: <1.3 {Log} (ref <1.30)

## 2012-07-13 LAB — CORTISOL: Cortisol, Plasma: 4.2 ug/dL

## 2012-07-13 LAB — ROCKY MTN SPOTTED FVR AB, IGM-BLOOD: RMSF IgM: 0.06 IV (ref 0.00–0.89)

## 2012-07-13 MED ORDER — VANCOMYCIN HCL IN DEXTROSE 1-5 GM/200ML-% IV SOLN
1000.0000 mg | Freq: Three times a day (TID) | INTRAVENOUS | Status: DC
  Administered 2012-07-13: 1000 mg via INTRAVENOUS
  Filled 2012-07-13 (×2): qty 200

## 2012-07-13 MED ORDER — SODIUM CHLORIDE 0.9 % IV SOLN
INTRAVENOUS | Status: DC
  Administered 2012-07-13: 23:00:00 via INTRAVENOUS
  Administered 2012-07-15: 100 mL/h via INTRAVENOUS

## 2012-07-13 MED ORDER — OXYCODONE-ACETAMINOPHEN 5-325 MG PO TABS
1.0000 | ORAL_TABLET | ORAL | Status: DC | PRN
  Administered 2012-07-13: 1 via ORAL
  Filled 2012-07-13: qty 1

## 2012-07-13 MED ORDER — ENSURE COMPLETE PO LIQD
237.0000 mL | Freq: Two times a day (BID) | ORAL | Status: DC
  Administered 2012-07-15: 237 mL via ORAL

## 2012-07-13 MED ORDER — MORPHINE SULFATE 2 MG/ML IJ SOLN: 2.0000 mg | Freq: Once | INTRAMUSCULAR | Status: DC

## 2012-07-13 NOTE — Progress Notes (Signed)
INFECTIOUS DISEASE PROGRESS NOTE  ID: Calvin Wolf is a 22 y.o. male with  Principal Problem:   Neutropenic fever Active Problems:   Lump in neck   Immigrant with language difficulty  Subjective: Without complaints, no dysphagia  Abtx:  Anti-infectives   Start     Dose/Rate Route Frequency Ordered Stop   07/13/12 1200  vancomycin (VANCOCIN) IVPB 1000 mg/200 mL premix     1,000 mg 200 mL/hr over 60 Minutes Intravenous Every 8 hours 07/13/12 0645     07/12/12 1000  doxycycline (VIBRAMYCIN) 100 mg in dextrose 5 % 250 mL IVPB     100 mg 125 mL/hr over 120 Minutes Intravenous Every 12 hours 07/12/12 0916     07/11/12 2200  vancomycin (VANCOCIN) IVPB 750 mg/150 ml premix  Status:  Discontinued     750 mg 150 mL/hr over 60 Minutes Intravenous Every 8 hours 07/11/12 2112 07/13/12 0645   07/11/12 2200  piperacillin-tazobactam (ZOSYN) IVPB 3.375 g     3.375 g 12.5 mL/hr over 240 Minutes Intravenous Every 8 hours 07/11/12 2112        Medications:  Scheduled: . doxycycline (VIBRAMYCIN) IV  100 mg Intravenous Q12H  . feeding supplement  237 mL Oral BID BM  . piperacillin-tazobactam (ZOSYN)  IV  3.375 g Intravenous Q8H  . vancomycin  1,000 mg Intravenous Q8H    Objective: Vital signs in last 24 hours: Temp:  [98.4 F (36.9 C)-98.8 F (37.1 C)] 98.7 F (37.1 C) (07/09 1200) Pulse Rate:  [49-90] 57 (07/09 1200) Resp:  [14-21] 18 (07/09 1200) BP: (97-114)/(43-70) 102/50 mmHg (07/09 0800) SpO2:  [97 %-100 %] 100 % (07/09 1200)   General appearance: no distress Neck: R neck unchanged.  Resp: clear to auscultation bilaterally Cardio: regular rate and rhythm GI: normal findings: bowel sounds normal and soft, non-tender  Lab Results  Recent Labs  07/11/12 2010 07/11/12 2011 07/13/12 0505  WBC 1.4*  --  1.7*  HGB 13.5  --  13.1  HCT 40.3  --  38.8*  NA  --  135 137  K  --  3.8 3.5  CL  --  99 100  CO2  --  29 29  BUN  --  10 7  CREATININE  --  0.82 0.76    Liver Panel  Recent Labs  07/11/12 2011 07/13/12 0505  PROT 7.2 7.3  ALBUMIN 3.7 3.6  AST 24 21  ALT 16 15  ALKPHOS 70 67  BILITOT 0.3 0.4   Sedimentation Rate No results found for this basename: ESRSEDRATE,  in the last 72 hours C-Reactive Protein No results found for this basename: CRP,  in the last 72 hours  Microbiology: Recent Results (from the past 240 hour(s))  MRSA PCR SCREENING     Status: None   Collection Time    07/11/12  8:47 PM      Result Value Range Status   MRSA by PCR NEGATIVE  NEGATIVE Final   Comment:            The GeneXpert MRSA Assay (FDA     approved for NASAL specimens     only), is one component of a     comprehensive MRSA colonization     surveillance program. It is not     intended to diagnose MRSA     infection nor to guide or     monitor treatment for     MRSA infections.  CULTURE, BLOOD (ROUTINE X 2)  Status: None   Collection Time    07/11/12 10:50 PM      Result Value Range Status   Specimen Description BLOOD RIGHT ARM   Final   Special Requests BOTTLES DRAWN AEROBIC AND ANAEROBIC 5CC   Final   Culture  Setup Time 07/12/2012 05:56   Final   Culture     Final   Value:        BLOOD CULTURE RECEIVED NO GROWTH TO DATE CULTURE WILL BE HELD FOR 5 DAYS BEFORE ISSUING A FINAL NEGATIVE REPORT   Report Status PENDING   Incomplete  CULTURE, BLOOD (ROUTINE X 2)     Status: None   Collection Time    07/11/12 10:55 PM      Result Value Range Status   Specimen Description BLOOD LEFT ARM   Final   Special Requests BOTTLES DRAWN AEROBIC AND ANAEROBIC 2CC   Final   Culture  Setup Time 07/12/2012 05:57   Final   Culture     Final   Value:        BLOOD CULTURE RECEIVED NO GROWTH TO DATE CULTURE WILL BE HELD FOR 5 DAYS BEFORE ISSUING A FINAL NEGATIVE REPORT   Report Status PENDING   Incomplete  MALARIA SMEAR     Status: None   Collection Time    07/12/12  9:15 AM      Result Value Range Status   Specimen Description BLOOD   Final    Special Requests Immunocompromised   Final   Malaria Prep     Final   Value: No Plasmodium or Other Blood Parasites Seen on Thick or Thin Smears For persons strongly suspected of having a blood parasite,but have negative smears, it is recommended that blood films be repeated approximately every 12 to 24 hours for 3 consecutive      days.   Report Status 07/13/2012 FINAL   Final    Studies/Results: Ct Soft Tissue Neck W Contrast  07/12/2012   *RADIOLOGY REPORT*  Clinical Data:  Fever and neutropenia.  Mass in the right side of the neck.  Mass is painful to touch.  CT NECK, CHEST, ABDOMEN AND PELVIS WITH CONTRAST  Technique:  Multidetector CT imaging of the neck, chest, abdomen and pelvis was performed using the standard protocol following the bolus administration of intravenous contrast.  Contrast: 50mL OMNIPAQUE IOHEXOL 300 MG/ML  SOLN, OMNIPAQUE IOHEXOL 300 MG/ML  SOLN  Comparison:   None.  CT NECK  Findings:  There is right-sided cervical and supraclavicular lymphadenopathy with enhancing lymph nodes demonstrated IIA, IIB, III, IV and V levels.  Cervical lymph nodes measure up to about 1.1 x 2.5 cm diameter.  Supraclavicular lymph nodes measure up to about 0.9 x 1.4 cm.  No significant lymphadenopathy is demonstrated on the left.  Changes could be due to infectious or inflammatory process, lymphoma, or metastasis.  Tonsillar adenoidal tissues are not enlarged.  No mucosal space or prevertebral lesions demonstrated.  Salivary glands appear homogeneous and symmetrical. Carotid and jugular vessels are patent and not effaced.  Laryngeal structures appear intact.  Cervical airway appears patent.  Thyroid gland appears homogeneous.  Normal alignment of the cervical vertebrae.  No destructive bone lesions appreciated.  IMPRESSION: Fairly extensive lymphadenopathy along the right cervical and supraclavicular regions.  Changes could represent lymphoma, infectious or inflammatory process, or metastasis.  CT  CHEST  Findings: Right-sided supraclavicular lymphadenopathy as previously discussed.  See above.  No significant axillary, mediastinal, or hilar lymphadenopathy on either side.  Normal heart size.  Normal caliber thoracic aorta.  No mediastinal mass lesion.  Esophagus is decompressed.  Small calcified lymph node in the middle mediastinum to the right of the right mainstem bronchus.  No pleural effusions. Lungs appear clear.  No focal lung mass, consolidation, airspace disease, or interstitial infiltration.  Airways appear patent.  No pneumothorax.  Normal alignment of the thoracic spine.  No destructive bone lesions are appreciated.  IMPRESSION: Right supraclavicular lymphadenopathy.  Chest is otherwise negative.  CT ABDOMEN AND PELVIS  Findings: Spleen is mildly enlarged, measuring about 5.8 x 10.5 x 13.6 cm.  Normal homogeneous parenchymal echotexture.  The liver, gallbladder, pancreas, adrenal glands, kidneys, abdominal aorta, and inferior vena cava are unremarkable.  Mesenteric and retroperitoneal lymph nodes are present but without pathologic enlargement.  The stomach, small bowel, and colon are decompressed. No free air or free fluid in the abdomen.  Abdominal wall musculature appears intact.  Pelvis:  The prostate gland is not enlarged.  Bladder wall is not thickened.  Stool filled rectosigmoid colon without diverticulitis. The appendix is normal.  No free or loculated pelvic fluid collections.  No significant lymphadenopathy in the pelvis or groin regions.  Visualized growing nodes are not pathologically enlarged. Normal alignment of the lumbar vertebra.  No destructive bone lesions appreciated.  IMPRESSION: Mild splenic enlargement.  No significant lymphadenopathy in the abdomen or pelvis.   Original Report Authenticated By: Burman Nieves, M.D.   Ct Chest W Contrast  07/12/2012   *RADIOLOGY REPORT*  Clinical Data:  Fever and neutropenia.  Mass in the right side of the neck.  Mass is painful to touch.   CT NECK, CHEST, ABDOMEN AND PELVIS WITH CONTRAST  Technique:  Multidetector CT imaging of the neck, chest, abdomen and pelvis was performed using the standard protocol following the bolus administration of intravenous contrast.  Contrast: 50mL OMNIPAQUE IOHEXOL 300 MG/ML  SOLN, OMNIPAQUE IOHEXOL 300 MG/ML  SOLN  Comparison:   None.  CT NECK  Findings:  There is right-sided cervical and supraclavicular lymphadenopathy with enhancing lymph nodes demonstrated IIA, IIB, III, IV and V levels.  Cervical lymph nodes measure up to about 1.1 x 2.5 cm diameter.  Supraclavicular lymph nodes measure up to about 0.9 x 1.4 cm.  No significant lymphadenopathy is demonstrated on the left.  Changes could be due to infectious or inflammatory process, lymphoma, or metastasis.  Tonsillar adenoidal tissues are not enlarged.  No mucosal space or prevertebral lesions demonstrated.  Salivary glands appear homogeneous and symmetrical. Carotid and jugular vessels are patent and not effaced.  Laryngeal structures appear intact.  Cervical airway appears patent.  Thyroid gland appears homogeneous.  Normal alignment of the cervical vertebrae.  No destructive bone lesions appreciated.  IMPRESSION: Fairly extensive lymphadenopathy along the right cervical and supraclavicular regions.  Changes could represent lymphoma, infectious or inflammatory process, or metastasis.  CT CHEST  Findings: Right-sided supraclavicular lymphadenopathy as previously discussed.  See above.  No significant axillary, mediastinal, or hilar lymphadenopathy on either side.  Normal heart size.  Normal caliber thoracic aorta.  No mediastinal mass lesion.  Esophagus is decompressed.  Small calcified lymph node in the middle mediastinum to the right of the right mainstem bronchus.  No pleural effusions. Lungs appear clear.  No focal lung mass, consolidation, airspace disease, or interstitial infiltration.  Airways appear patent.  No pneumothorax.  Normal alignment of the  thoracic spine.  No destructive bone lesions are appreciated.  IMPRESSION: Right supraclavicular lymphadenopathy.  Chest is otherwise  negative.  CT ABDOMEN AND PELVIS  Findings: Spleen is mildly enlarged, measuring about 5.8 x 10.5 x 13.6 cm.  Normal homogeneous parenchymal echotexture.  The liver, gallbladder, pancreas, adrenal glands, kidneys, abdominal aorta, and inferior vena cava are unremarkable.  Mesenteric and retroperitoneal lymph nodes are present but without pathologic enlargement.  The stomach, small bowel, and colon are decompressed. No free air or free fluid in the abdomen.  Abdominal wall musculature appears intact.  Pelvis:  The prostate gland is not enlarged.  Bladder wall is not thickened.  Stool filled rectosigmoid colon without diverticulitis. The appendix is normal.  No free or loculated pelvic fluid collections.  No significant lymphadenopathy in the pelvis or groin regions.  Visualized growing nodes are not pathologically enlarged. Normal alignment of the lumbar vertebra.  No destructive bone lesions appreciated.  IMPRESSION: Mild splenic enlargement.  No significant lymphadenopathy in the abdomen or pelvis.   Original Report Authenticated By: Burman Nieves, M.D.   Ct Abdomen Pelvis W Contrast  07/12/2012   *RADIOLOGY REPORT*  Clinical Data:  Fever and neutropenia.  Mass in the right side of the neck.  Mass is painful to touch.  CT NECK, CHEST, ABDOMEN AND PELVIS WITH CONTRAST  Technique:  Multidetector CT imaging of the neck, chest, abdomen and pelvis was performed using the standard protocol following the bolus administration of intravenous contrast.  Contrast: 50mL OMNIPAQUE IOHEXOL 300 MG/ML  SOLN, OMNIPAQUE IOHEXOL 300 MG/ML  SOLN  Comparison:   None.  CT NECK  Findings:  There is right-sided cervical and supraclavicular lymphadenopathy with enhancing lymph nodes demonstrated IIA, IIB, III, IV and V levels.  Cervical lymph nodes measure up to about 1.1 x 2.5 cm diameter.   Supraclavicular lymph nodes measure up to about 0.9 x 1.4 cm.  No significant lymphadenopathy is demonstrated on the left.  Changes could be due to infectious or inflammatory process, lymphoma, or metastasis.  Tonsillar adenoidal tissues are not enlarged.  No mucosal space or prevertebral lesions demonstrated.  Salivary glands appear homogeneous and symmetrical. Carotid and jugular vessels are patent and not effaced.  Laryngeal structures appear intact.  Cervical airway appears patent.  Thyroid gland appears homogeneous.  Normal alignment of the cervical vertebrae.  No destructive bone lesions appreciated.  IMPRESSION: Fairly extensive lymphadenopathy along the right cervical and supraclavicular regions.  Changes could represent lymphoma, infectious or inflammatory process, or metastasis.  CT CHEST  Findings: Right-sided supraclavicular lymphadenopathy as previously discussed.  See above.  No significant axillary, mediastinal, or hilar lymphadenopathy on either side.  Normal heart size.  Normal caliber thoracic aorta.  No mediastinal mass lesion.  Esophagus is decompressed.  Small calcified lymph node in the middle mediastinum to the right of the right mainstem bronchus.  No pleural effusions. Lungs appear clear.  No focal lung mass, consolidation, airspace disease, or interstitial infiltration.  Airways appear patent.  No pneumothorax.  Normal alignment of the thoracic spine.  No destructive bone lesions are appreciated.  IMPRESSION: Right supraclavicular lymphadenopathy.  Chest is otherwise negative.  CT ABDOMEN AND PELVIS  Findings: Spleen is mildly enlarged, measuring about 5.8 x 10.5 x 13.6 cm.  Normal homogeneous parenchymal echotexture.  The liver, gallbladder, pancreas, adrenal glands, kidneys, abdominal aorta, and inferior vena cava are unremarkable.  Mesenteric and retroperitoneal lymph nodes are present but without pathologic enlargement.  The stomach, small bowel, and colon are decompressed. No free air  or free fluid in the abdomen.  Abdominal wall musculature appears intact.  Pelvis:  The  prostate gland is not enlarged.  Bladder wall is not thickened.  Stool filled rectosigmoid colon without diverticulitis. The appendix is normal.  No free or loculated pelvic fluid collections.  No significant lymphadenopathy in the pelvis or groin regions.  Visualized growing nodes are not pathologically enlarged. Normal alignment of the lumbar vertebra.  No destructive bone lesions appreciated.  IMPRESSION: Mild splenic enlargement.  No significant lymphadenopathy in the abdomen or pelvis.   Original Report Authenticated By: Burman Nieves, M.D.     Assessment/Plan: Fever, LAN  Will stop vanco Await serologies Negative to date: TSH, Malaria, RPR, EBV VCA IgM, RMSF, HIV Ab,   Total days of antibiotics: 3 (doxy/vanco/zosyn)     Johny Sax Infectious Diseases (pager) [phone] [email or web address] 07/13/2012, 3:06 PM   LOS: 2 days

## 2012-07-13 NOTE — Progress Notes (Signed)
[facility] PROGRESS NOTE  Calvin Wolf QMV:[id number] DOB: [birth year] DOA: 07/11/2012 PCP: No primary provider on file.  Assessment/Plan: 1-Neutropenic fever: with enlarged lymph node. Concerns for infectious etiology vs malignancy.  -will continue vanc, zosyn and doxycycline day 3. -per hem/onc if no changes in the next 24-48 hours with antibiotic therapy and supportive care will need to performed excisional lymph node biopsy. Will follow heme/onc recommendation regarding biopsy.  -Checking HIV RNA, Ehrlichia, RMSF, RPR non reactive, malaria smear negative, Cat scratch, EBV IgG more 750, CMV IgM pending, toxo, quantiferon/TB as recommended by ID  -WBC increasing.  -Blood culture pending.   2-Thrombocytopenia: most likely secondary to infection/ongoing process.  -will avoid heparin products and follow trend.   3-Soft BP: Continue to monitor. Lactic acid 0.8. Will check cortisol level, TSH. Start IV fluids.  4-DVT:SCD's   Code Status: Full Code.  Family Communication: Care discussed with patient.  Disposition Plan: Remain step down.    Consultants:  ID  Onc  Procedures:  none  Antibiotics: Zenaida Niece 7-7 Zosyn 7-7 Doxycycline 7-8   HPI/Subjective: Feeling well. Eating well. Denies diarrhea. No chest pain, or dyspnea. He does relates left arm pain.   Objective: Filed Vitals:   07/12/12 2000 07/13/12 0000 07/13/12 0400 07/13/12 0700  BP: 97/43 103/70 102/50   Pulse: 57 90 64 50  Temp: 98.6 F (37 C) 98.5 F (36.9 C) 98.8 F (37.1 C)   TempSrc: Oral Oral Oral   Resp: 17 21 21 21   Height:      Weight:      SpO2: 99% 99% 100% 97%    Intake/Output Summary (Last 24 hours) at 07/13/12 0749 Last data filed at 07/13/12 0506  Gross per 24 hour  Intake   2787 ml  Output   1850 ml  Net    937 ml   Filed Weights   07/11/12 2100  Weight: 56.246 kg (124 lb)    Exam:   General:  No distress.   Cardiovascular: S 1, S 2 RRR  Respiratory: CTA  Abdomen: Bs  present, soft, NT  Musculoskeletal: no edema.   Data Reviewed: Basic Metabolic Panel:  Recent Labs Lab 07/11/12 2011 07/13/12 0505  NA 135 137  K 3.8 3.5  CL 99 100  CO2 29 29  GLUCOSE 81 99  BUN 10 7  CREATININE 0.82 0.76  CALCIUM 9.1 9.0   Liver Function Tests:  Recent Labs Lab 07/11/12 2011 07/13/12 0505  AST 24 21  ALT 16 15  ALKPHOS 70 67  BILITOT 0.3 0.4  PROT 7.2 7.3  ALBUMIN 3.7 3.6   No results found for this basename: LIPASE, AMYLASE,  in the last 168 hours No results found for this basename: AMMONIA,  in the last 168 hours CBC:  Recent Labs Lab 07/11/12 2010 07/13/12 0505  WBC 1.4* 1.7*  NEUTROABS 0.5* 0.5*  HGB 13.5 13.1  HCT 40.3 38.8*  MCV 83.6 84.0  PLT 124* 116*   Cardiac Enzymes: No results found for this basename: CKTOTAL, CKMB, CKMBINDEX, TROPONINI,  in the last 168 hours BNP (last 3 results) No results found for this basename: PROBNP,  in the last 8760 hours CBG: No results found for this basename: GLUCAP,  in the last 168 hours  Recent Results (from the past 240 hour(s))  MRSA PCR SCREENING     Status: None   Collection Time    07/11/12  8:47 PM      Result Value Range Status   MRSA by PCR  NEGATIVE  NEGATIVE Final   Comment:            The GeneXpert MRSA Assay (FDA     approved for NASAL specimens     only), is one component of a     comprehensive MRSA colonization     surveillance program. It is not     intended to diagnose MRSA     infection nor to guide or     monitor treatment for     MRSA infections.  MALARIA SMEAR     Status: None   Collection Time    07/12/12  9:15 AM      Result Value Range Status   Specimen Description BLOOD   Final   Special Requests Immunocompromised   Final   Malaria Prep     Final   Value: PRESUMPTIVE RESULTS NEGATIVE FOR PLASMODIUM AND OTHER BLOOD PARASITES; BASED ON EXAMINATION OF  THIN SMEARS.  FINAL REPORT PENDING REVIEW OF THICK SMEARS. CRITICAL RESULT CALLED TO, READ BACK BY AND  VERIFIED WITH: MICHELLE AT 1610 07/13/12 BY SNOLO   Report Status PENDING   Incomplete     Studies: Ct Soft Tissue Neck W Contrast  07/12/2012   *RADIOLOGY REPORT*  Clinical Data:  Fever and neutropenia.  Mass in the right side of the neck.  Mass is painful to touch.  CT NECK, CHEST, ABDOMEN AND PELVIS WITH CONTRAST  Technique:  Multidetector CT imaging of the neck, chest, abdomen and pelvis was performed using the standard protocol following the bolus administration of intravenous contrast.  Contrast: 50mL OMNIPAQUE IOHEXOL 300 MG/ML  SOLN, OMNIPAQUE IOHEXOL 300 MG/ML  SOLN  Comparison:   None.  CT NECK  Findings:  There is right-sided cervical and supraclavicular lymphadenopathy with enhancing lymph nodes demonstrated IIA, IIB, III, IV and V levels.  Cervical lymph nodes measure up to about 1.1 x 2.5 cm diameter.  Supraclavicular lymph nodes measure up to about 0.9 x 1.4 cm.  No significant lymphadenopathy is demonstrated on the left.  Changes could be due to infectious or inflammatory process, lymphoma, or metastasis.  Tonsillar adenoidal tissues are not enlarged.  No mucosal space or prevertebral lesions demonstrated.  Salivary glands appear homogeneous and symmetrical. Carotid and jugular vessels are patent and not effaced.  Laryngeal structures appear intact.  Cervical airway appears patent.  Thyroid gland appears homogeneous.  Normal alignment of the cervical vertebrae.  No destructive bone lesions appreciated.  IMPRESSION: Fairly extensive lymphadenopathy along the right cervical and supraclavicular regions.  Changes could represent lymphoma, infectious or inflammatory process, or metastasis.  CT CHEST  Findings: Right-sided supraclavicular lymphadenopathy as previously discussed.  See above.  No significant axillary, mediastinal, or hilar lymphadenopathy on either side.  Normal heart size.  Normal caliber thoracic aorta.  No mediastinal mass lesion.  Esophagus is decompressed.  Small calcified  lymph node in the middle mediastinum to the right of the right mainstem bronchus.  No pleural effusions. Lungs appear clear.  No focal lung mass, consolidation, airspace disease, or interstitial infiltration.  Airways appear patent.  No pneumothorax.  Normal alignment of the thoracic spine.  No destructive bone lesions are appreciated.  IMPRESSION: Right supraclavicular lymphadenopathy.  Chest is otherwise negative.  CT ABDOMEN AND PELVIS  Findings: Spleen is mildly enlarged, measuring about 5.8 x 10.5 x 13.6 cm.  Normal homogeneous parenchymal echotexture.  The liver, gallbladder, pancreas, adrenal glands, kidneys, abdominal aorta, and inferior vena cava are unremarkable.  Mesenteric and retroperitoneal lymph nodes are present  but without pathologic enlargement.  The stomach, small bowel, and colon are decompressed. No free air or free fluid in the abdomen.  Abdominal wall musculature appears intact.  Pelvis:  The prostate gland is not enlarged.  Bladder wall is not thickened.  Stool filled rectosigmoid colon without diverticulitis. The appendix is normal.  No free or loculated pelvic fluid collections.  No significant lymphadenopathy in the pelvis or groin regions.  Visualized growing nodes are not pathologically enlarged. Normal alignment of the lumbar vertebra.  No destructive bone lesions appreciated.  IMPRESSION: Mild splenic enlargement.  No significant lymphadenopathy in the abdomen or pelvis.   Original Report Authenticated By: Burman Nieves, M.D.   Ct Chest W Contrast  07/12/2012   *RADIOLOGY REPORT*  Clinical Data:  Fever and neutropenia.  Mass in the right side of the neck.  Mass is painful to touch.  CT NECK, CHEST, ABDOMEN AND PELVIS WITH CONTRAST  Technique:  Multidetector CT imaging of the neck, chest, abdomen and pelvis was performed using the standard protocol following the bolus administration of intravenous contrast.  Contrast: 50mL OMNIPAQUE IOHEXOL 300 MG/ML  SOLN, OMNIPAQUE IOHEXOL  300 MG/ML  SOLN  Comparison:   None.  CT NECK  Findings:  There is right-sided cervical and supraclavicular lymphadenopathy with enhancing lymph nodes demonstrated IIA, IIB, III, IV and V levels.  Cervical lymph nodes measure up to about 1.1 x 2.5 cm diameter.  Supraclavicular lymph nodes measure up to about 0.9 x 1.4 cm.  No significant lymphadenopathy is demonstrated on the left.  Changes could be due to infectious or inflammatory process, lymphoma, or metastasis.  Tonsillar adenoidal tissues are not enlarged.  No mucosal space or prevertebral lesions demonstrated.  Salivary glands appear homogeneous and symmetrical. Carotid and jugular vessels are patent and not effaced.  Laryngeal structures appear intact.  Cervical airway appears patent.  Thyroid gland appears homogeneous.  Normal alignment of the cervical vertebrae.  No destructive bone lesions appreciated.  IMPRESSION: Fairly extensive lymphadenopathy along the right cervical and supraclavicular regions.  Changes could represent lymphoma, infectious or inflammatory process, or metastasis.  CT CHEST  Findings: Right-sided supraclavicular lymphadenopathy as previously discussed.  See above.  No significant axillary, mediastinal, or hilar lymphadenopathy on either side.  Normal heart size.  Normal caliber thoracic aorta.  No mediastinal mass lesion.  Esophagus is decompressed.  Small calcified lymph node in the middle mediastinum to the right of the right mainstem bronchus.  No pleural effusions. Lungs appear clear.  No focal lung mass, consolidation, airspace disease, or interstitial infiltration.  Airways appear patent.  No pneumothorax.  Normal alignment of the thoracic spine.  No destructive bone lesions are appreciated.  IMPRESSION: Right supraclavicular lymphadenopathy.  Chest is otherwise negative.  CT ABDOMEN AND PELVIS  Findings: Spleen is mildly enlarged, measuring about 5.8 x 10.5 x 13.6 cm.  Normal homogeneous parenchymal echotexture.  The liver,  gallbladder, pancreas, adrenal glands, kidneys, abdominal aorta, and inferior vena cava are unremarkable.  Mesenteric and retroperitoneal lymph nodes are present but without pathologic enlargement.  The stomach, small bowel, and colon are decompressed. No free air or free fluid in the abdomen.  Abdominal wall musculature appears intact.  Pelvis:  The prostate gland is not enlarged.  Bladder wall is not thickened.  Stool filled rectosigmoid colon without diverticulitis. The appendix is normal.  No free or loculated pelvic fluid collections.  No significant lymphadenopathy in the pelvis or groin regions.  Visualized growing nodes are not pathologically enlarged. Normal alignment  of the lumbar vertebra.  No destructive bone lesions appreciated.  IMPRESSION: Mild splenic enlargement.  No significant lymphadenopathy in the abdomen or pelvis.   Original Report Authenticated By: Burman Nieves, M.D.   Ct Abdomen Pelvis W Contrast  07/12/2012   *RADIOLOGY REPORT*  Clinical Data:  Fever and neutropenia.  Mass in the right side of the neck.  Mass is painful to touch.  CT NECK, CHEST, ABDOMEN AND PELVIS WITH CONTRAST  Technique:  Multidetector CT imaging of the neck, chest, abdomen and pelvis was performed using the standard protocol following the bolus administration of intravenous contrast.  Contrast: 50mL OMNIPAQUE IOHEXOL 300 MG/ML  SOLN, OMNIPAQUE IOHEXOL 300 MG/ML  SOLN  Comparison:   None.  CT NECK  Findings:  There is right-sided cervical and supraclavicular lymphadenopathy with enhancing lymph nodes demonstrated IIA, IIB, III, IV and V levels.  Cervical lymph nodes measure up to about 1.1 x 2.5 cm diameter.  Supraclavicular lymph nodes measure up to about 0.9 x 1.4 cm.  No significant lymphadenopathy is demonstrated on the left.  Changes could be due to infectious or inflammatory process, lymphoma, or metastasis.  Tonsillar adenoidal tissues are not enlarged.  No mucosal space or prevertebral lesions  demonstrated.  Salivary glands appear homogeneous and symmetrical. Carotid and jugular vessels are patent and not effaced.  Laryngeal structures appear intact.  Cervical airway appears patent.  Thyroid gland appears homogeneous.  Normal alignment of the cervical vertebrae.  No destructive bone lesions appreciated.  IMPRESSION: Fairly extensive lymphadenopathy along the right cervical and supraclavicular regions.  Changes could represent lymphoma, infectious or inflammatory process, or metastasis.  CT CHEST  Findings: Right-sided supraclavicular lymphadenopathy as previously discussed.  See above.  No significant axillary, mediastinal, or hilar lymphadenopathy on either side.  Normal heart size.  Normal caliber thoracic aorta.  No mediastinal mass lesion.  Esophagus is decompressed.  Small calcified lymph node in the middle mediastinum to the right of the right mainstem bronchus.  No pleural effusions. Lungs appear clear.  No focal lung mass, consolidation, airspace disease, or interstitial infiltration.  Airways appear patent.  No pneumothorax.  Normal alignment of the thoracic spine.  No destructive bone lesions are appreciated.  IMPRESSION: Right supraclavicular lymphadenopathy.  Chest is otherwise negative.  CT ABDOMEN AND PELVIS  Findings: Spleen is mildly enlarged, measuring about 5.8 x 10.5 x 13.6 cm.  Normal homogeneous parenchymal echotexture.  The liver, gallbladder, pancreas, adrenal glands, kidneys, abdominal aorta, and inferior vena cava are unremarkable.  Mesenteric and retroperitoneal lymph nodes are present but without pathologic enlargement.  The stomach, small bowel, and colon are decompressed. No free air or free fluid in the abdomen.  Abdominal wall musculature appears intact.  Pelvis:  The prostate gland is not enlarged.  Bladder wall is not thickened.  Stool filled rectosigmoid colon without diverticulitis. The appendix is normal.  No free or loculated pelvic fluid collections.  No significant  lymphadenopathy in the pelvis or groin regions.  Visualized growing nodes are not pathologically enlarged. Normal alignment of the lumbar vertebra.  No destructive bone lesions appreciated.  IMPRESSION: Mild splenic enlargement.  No significant lymphadenopathy in the abdomen or pelvis.   Original Report Authenticated By: Burman Nieves, M.D.    Scheduled Meds: . doxycycline (VIBRAMYCIN) IV  100 mg Intravenous Q12H  . piperacillin-tazobactam (ZOSYN)  IV  3.375 g Intravenous Q8H  . vancomycin  1,000 mg Intravenous Q8H   Continuous Infusions:   Principal Problem:   Neutropenic fever Active Problems:  Lump in neck   Immigrant with language difficulty    Time spent: 35 minutes.     Feiga Nadel  [facility] Pager [phone]. If 7PM-7AM, please contact night-coverage at [email or web address], password [facility] 07/13/2012, 7:49 AM  LOS: 2 days

## 2012-07-13 NOTE — Progress Notes (Signed)
Calvin Wolf   DOB:03-Jun-1990   XB#:[id number]   ZHY#:[id number]  Subjective: woken from sleep; tells me he is "better;" not getting OOB; eating some, no constipation; no family in room (has not contacted his parents re. current issue)   Objective: woung middle [place] man examined in bed Filed Vitals:   07/13/12 0800  BP:   Pulse:   Temp: 98.6 F (37 C)  Resp:     Body mass index is 18.86 kg/(m^2).  Intake/Output Summary (Last 24 hours) at 07/13/12 1259 Last data filed at 07/13/12 0600  Gross per 24 hour  Intake   2362 ml  Output   1850 ml  Net    512 ml     Sclerae unicteric  Right neck mass is tender, not fluctuant, axillae benign  Lungs clear -- no rales or rhonchi  Heart regular rate and rhythm  Abdomen benign,  MSK no peripheral edema  Neuro nonfocal, well oriented, appropriate affect    CBG (last 3)  No results found for this basename: GLUCAP,  in the last 72 hours   Labs:  Lab Results  Component Value Date   WBC 1.7* 07/13/2012   HGB 13.1 07/13/2012   HCT 38.8* 07/13/2012   MCV 84.0 07/13/2012   PLT 116* 07/13/2012   NEUTROABS 0.5* 07/13/2012    @LASTCHEMISTRY @  Urine Studies No results found for this basename: UACOL, UAPR, USPG, UPH, UTP, UGL, UKET, UBIL, UHGB, UNIT, UROB, ULEU, UEPI, UWBC, URBC, UBAC, CAST, CRYS, UCOM, BILUA,  in the last 72 hours  Basic Metabolic Panel:  Recent Labs Lab 07/11/12 2011 07/13/12 0505  NA 135 137  K 3.8 3.5  CL 99 100  CO2 29 29  GLUCOSE 81 99  BUN 10 7  CREATININE 0.82 0.76  CALCIUM 9.1 9.0   GFR Estimated Creatinine Clearance: 117.1 ml/min (by C-G formula based on Cr of 0.76). Liver Function Tests:  Recent Labs Lab 07/11/12 2011 07/13/12 0505  AST 24 21  ALT 16 15  ALKPHOS 70 67  BILITOT 0.3 0.4  PROT 7.2 7.3  ALBUMIN 3.7 3.6   No results found for this basename: LIPASE, AMYLASE,  in the last 168 hours No results found for this basename: AMMONIA,  in the last 168 hours Coagulation profile No results  found for this basename: INR, PROTIME,  in the last 168 hours  CBC:  Recent Labs Lab 07/11/12 2010 07/13/12 0505  WBC 1.4* 1.7*  NEUTROABS 0.5* 0.5*  HGB 13.5 13.1  HCT 40.3 38.8*  MCV 83.6 84.0  PLT 124* 116*   Cardiac Enzymes: No results found for this basename: CKTOTAL, CKMB, CKMBINDEX, TROPONINI,  in the last 168 hours BNP: No components found with this basename: POCBNP,  CBG: No results found for this basename: GLUCAP,  in the last 168 hours D-Dimer No results found for this basename: DDIMER,  in the last 72 hours Hgb A1c No results found for this basename: HGBA1C,  in the last 72 hours Lipid Profile No results found for this basename: CHOL, HDL, LDLCALC, TRIG, CHOLHDL, LDLDIRECT,  in the last 72 hours Thyroid function studies  Recent Labs  07/13/12 0845  TSH 2.843   Anemia work up No results found for this basename: VITAMINB12, FOLATE, FERRITIN, TIBC, IRON, RETICCTPCT,  in the last 72 hours Microbiology Recent Results (from the past 240 hour(s))  MRSA PCR SCREENING     Status: None   Collection Time    07/11/12  8:47 PM      Result  Value Range Status   MRSA by PCR NEGATIVE  NEGATIVE Final   Comment:            The GeneXpert MRSA Assay (FDA     approved for NASAL specimens     only), is one component of a     comprehensive MRSA colonization     surveillance program. It is not     intended to diagnose MRSA     infection nor to guide or     monitor treatment for     MRSA infections.  CULTURE, BLOOD (ROUTINE X 2)     Status: None   Collection Time    07/11/12 10:50 PM      Result Value Range Status   Specimen Description BLOOD RIGHT ARM   Final   Special Requests BOTTLES DRAWN AEROBIC AND ANAEROBIC 5CC   Final   Culture  Setup Time 07/12/2012 05:56   Final   Culture     Final   Value:        BLOOD CULTURE RECEIVED NO GROWTH TO DATE CULTURE WILL BE HELD FOR 5 DAYS BEFORE ISSUING A FINAL NEGATIVE REPORT   Report Status PENDING   Incomplete  CULTURE,  BLOOD (ROUTINE X 2)     Status: None   Collection Time    07/11/12 10:55 PM      Result Value Range Status   Specimen Description BLOOD LEFT ARM   Final   Special Requests BOTTLES DRAWN AEROBIC AND ANAEROBIC 2CC   Final   Culture  Setup Time 07/12/2012 05:57   Final   Culture     Final   Value:        BLOOD CULTURE RECEIVED NO GROWTH TO DATE CULTURE WILL BE HELD FOR 5 DAYS BEFORE ISSUING A FINAL NEGATIVE REPORT   Report Status PENDING   Incomplete  MALARIA SMEAR     Status: None   Collection Time    07/12/12  9:15 AM      Result Value Range Status   Specimen Description BLOOD   Final   Special Requests Immunocompromised   Final   Malaria Prep     Final   Value: No Plasmodium or Other Blood Parasites Seen on Thick or Thin Smears For persons strongly suspected of having a blood parasite,but have negative smears, it is recommended that blood films be repeated approximately every 12 to 24 hours for 3 consecutive      days.   Report Status 07/13/2012 FINAL   Final      Studies:  Ct Soft Tissue Neck W Contrast  07/12/2012   *RADIOLOGY REPORT*  Clinical Data:  Fever and neutropenia.  Mass in the right side of the neck.  Mass is painful to touch.  CT NECK, CHEST, ABDOMEN AND PELVIS WITH CONTRAST  Technique:  Multidetector CT imaging of the neck, chest, abdomen and pelvis was performed using the standard protocol following the bolus administration of intravenous contrast.  Contrast: 50mL OMNIPAQUE IOHEXOL 300 MG/ML  SOLN, OMNIPAQUE IOHEXOL 300 MG/ML  SOLN  Comparison:   None.  CT NECK  Findings:  There is right-sided cervical and supraclavicular lymphadenopathy with enhancing lymph nodes demonstrated IIA, IIB, III, IV and V levels.  Cervical lymph nodes measure up to about 1.1 x 2.5 cm diameter.  Supraclavicular lymph nodes measure up to about 0.9 x 1.4 cm.  No significant lymphadenopathy is demonstrated on the left.  Changes could be due to infectious or inflammatory process, lymphoma, or  metastasis.  Tonsillar adenoidal tissues are not enlarged.  No mucosal space or prevertebral lesions demonstrated.  Salivary glands appear homogeneous and symmetrical. Carotid and jugular vessels are patent and not effaced.  Laryngeal structures appear intact.  Cervical airway appears patent.  Thyroid gland appears homogeneous.  Normal alignment of the cervical vertebrae.  No destructive bone lesions appreciated.  IMPRESSION: Fairly extensive lymphadenopathy along the right cervical and supraclavicular regions.  Changes could represent lymphoma, infectious or inflammatory process, or metastasis.  CT CHEST  Findings: Right-sided supraclavicular lymphadenopathy as previously discussed.  See above.  No significant axillary, mediastinal, or hilar lymphadenopathy on either side.  Normal heart size.  Normal caliber thoracic aorta.  No mediastinal mass lesion.  Esophagus is decompressed.  Small calcified lymph node in the middle mediastinum to the right of the right mainstem bronchus.  No pleural effusions. Lungs appear clear.  No focal lung mass, consolidation, airspace disease, or interstitial infiltration.  Airways appear patent.  No pneumothorax.  Normal alignment of the thoracic spine.  No destructive bone lesions are appreciated.  IMPRESSION: Right supraclavicular lymphadenopathy.  Chest is otherwise negative.  CT ABDOMEN AND PELVIS  Findings: Spleen is mildly enlarged, measuring about 5.8 x 10.5 x 13.6 cm.  Normal homogeneous parenchymal echotexture.  The liver, gallbladder, pancreas, adrenal glands, kidneys, abdominal aorta, and inferior vena cava are unremarkable.  Mesenteric and retroperitoneal lymph nodes are present but without pathologic enlargement.  The stomach, small bowel, and colon are decompressed. No free air or free fluid in the abdomen.  Abdominal wall musculature appears intact.  Pelvis:  The prostate gland is not enlarged.  Bladder wall is not thickened.  Stool filled rectosigmoid colon without  diverticulitis. The appendix is normal.  No free or loculated pelvic fluid collections.  No significant lymphadenopathy in the pelvis or groin regions.  Visualized growing nodes are not pathologically enlarged. Normal alignment of the lumbar vertebra.  No destructive bone lesions appreciated.  IMPRESSION: Mild splenic enlargement.  No significant lymphadenopathy in the abdomen or pelvis.   Original Report Authenticated By: Burman Nieves, M.D.   Ct Chest W Contrast  07/12/2012   *RADIOLOGY REPORT*  Clinical Data:  Fever and neutropenia.  Mass in the right side of the neck.  Mass is painful to touch.  CT NECK, CHEST, ABDOMEN AND PELVIS WITH CONTRAST  Technique:  Multidetector CT imaging of the neck, chest, abdomen and pelvis was performed using the standard protocol following the bolus administration of intravenous contrast.  Contrast: 50mL OMNIPAQUE IOHEXOL 300 MG/ML  SOLN, OMNIPAQUE IOHEXOL 300 MG/ML  SOLN  Comparison:   None.  CT NECK  Findings:  There is right-sided cervical and supraclavicular lymphadenopathy with enhancing lymph nodes demonstrated IIA, IIB, III, IV and V levels.  Cervical lymph nodes measure up to about 1.1 x 2.5 cm diameter.  Supraclavicular lymph nodes measure up to about 0.9 x 1.4 cm.  No significant lymphadenopathy is demonstrated on the left.  Changes could be due to infectious or inflammatory process, lymphoma, or metastasis.  Tonsillar adenoidal tissues are not enlarged.  No mucosal space or prevertebral lesions demonstrated.  Salivary glands appear homogeneous and symmetrical. Carotid and jugular vessels are patent and not effaced.  Laryngeal structures appear intact.  Cervical airway appears patent.  Thyroid gland appears homogeneous.  Normal alignment of the cervical vertebrae.  No destructive bone lesions appreciated.  IMPRESSION: Fairly extensive lymphadenopathy along the right cervical and supraclavicular regions.  Changes could represent lymphoma, infectious or  inflammatory process, or  metastasis.  CT CHEST  Findings: Right-sided supraclavicular lymphadenopathy as previously discussed.  See above.  No significant axillary, mediastinal, or hilar lymphadenopathy on either side.  Normal heart size.  Normal caliber thoracic aorta.  No mediastinal mass lesion.  Esophagus is decompressed.  Small calcified lymph node in the middle mediastinum to the right of the right mainstem bronchus.  No pleural effusions. Lungs appear clear.  No focal lung mass, consolidation, airspace disease, or interstitial infiltration.  Airways appear patent.  No pneumothorax.  Normal alignment of the thoracic spine.  No destructive bone lesions are appreciated.  IMPRESSION: Right supraclavicular lymphadenopathy.  Chest is otherwise negative.  CT ABDOMEN AND PELVIS  Findings: Spleen is mildly enlarged, measuring about 5.8 x 10.5 x 13.6 cm.  Normal homogeneous parenchymal echotexture.  The liver, gallbladder, pancreas, adrenal glands, kidneys, abdominal aorta, and inferior vena cava are unremarkable.  Mesenteric and retroperitoneal lymph nodes are present but without pathologic enlargement.  The stomach, small bowel, and colon are decompressed. No free air or free fluid in the abdomen.  Abdominal wall musculature appears intact.  Pelvis:  The prostate gland is not enlarged.  Bladder wall is not thickened.  Stool filled rectosigmoid colon without diverticulitis. The appendix is normal.  No free or loculated pelvic fluid collections.  No significant lymphadenopathy in the pelvis or groin regions.  Visualized growing nodes are not pathologically enlarged. Normal alignment of the lumbar vertebra.  No destructive bone lesions appreciated.  IMPRESSION: Mild splenic enlargement.  No significant lymphadenopathy in the abdomen or pelvis.   Original Report Authenticated By: Burman Nieves, M.D.   Ct Abdomen Pelvis W Contrast  07/12/2012   *RADIOLOGY REPORT*  Clinical Data:  Fever and neutropenia.  Mass in the  right side of the neck.  Mass is painful to touch.  CT NECK, CHEST, ABDOMEN AND PELVIS WITH CONTRAST  Technique:  Multidetector CT imaging of the neck, chest, abdomen and pelvis was performed using the standard protocol following the bolus administration of intravenous contrast.  Contrast: 50mL OMNIPAQUE IOHEXOL 300 MG/ML  SOLN, OMNIPAQUE IOHEXOL 300 MG/ML  SOLN  Comparison:   None.  CT NECK  Findings:  There is right-sided cervical and supraclavicular lymphadenopathy with enhancing lymph nodes demonstrated IIA, IIB, III, IV and V levels.  Cervical lymph nodes measure up to about 1.1 x 2.5 cm diameter.  Supraclavicular lymph nodes measure up to about 0.9 x 1.4 cm.  No significant lymphadenopathy is demonstrated on the left.  Changes could be due to infectious or inflammatory process, lymphoma, or metastasis.  Tonsillar adenoidal tissues are not enlarged.  No mucosal space or prevertebral lesions demonstrated.  Salivary glands appear homogeneous and symmetrical. Carotid and jugular vessels are patent and not effaced.  Laryngeal structures appear intact.  Cervical airway appears patent.  Thyroid gland appears homogeneous.  Normal alignment of the cervical vertebrae.  No destructive bone lesions appreciated.  IMPRESSION: Fairly extensive lymphadenopathy along the right cervical and supraclavicular regions.  Changes could represent lymphoma, infectious or inflammatory process, or metastasis.  CT CHEST  Findings: Right-sided supraclavicular lymphadenopathy as previously discussed.  See above.  No significant axillary, mediastinal, or hilar lymphadenopathy on either side.  Normal heart size.  Normal caliber thoracic aorta.  No mediastinal mass lesion.  Esophagus is decompressed.  Small calcified lymph node in the middle mediastinum to the right of the right mainstem bronchus.  No pleural effusions. Lungs appear clear.  No focal lung mass, consolidation, airspace disease, or interstitial infiltration.  Airways appear  patent.  No pneumothorax.  Normal alignment of the thoracic spine.  No destructive bone lesions are appreciated.  IMPRESSION: Right supraclavicular lymphadenopathy.  Chest is otherwise negative.  CT ABDOMEN AND PELVIS  Findings: Spleen is mildly enlarged, measuring about 5.8 x 10.5 x 13.6 cm.  Normal homogeneous parenchymal echotexture.  The liver, gallbladder, pancreas, adrenal glands, kidneys, abdominal aorta, and inferior vena cava are unremarkable.  Mesenteric and retroperitoneal lymph nodes are present but without pathologic enlargement.  The stomach, small bowel, and colon are decompressed. No free air or free fluid in the abdomen.  Abdominal wall musculature appears intact.  Pelvis:  The prostate gland is not enlarged.  Bladder wall is not thickened.  Stool filled rectosigmoid colon without diverticulitis. The appendix is normal.  No free or loculated pelvic fluid collections.  No significant lymphadenopathy in the pelvis or groin regions.  Visualized growing nodes are not pathologically enlarged. Normal alignment of the lumbar vertebra.  No destructive bone lesions appreciated.  IMPRESSION: Mild splenic enlargement.  No significant lymphadenopathy in the abdomen or pelvis.   Original Report Authenticated By: Burman Nieves, M.D.    Assessment: 22 y.o. with fever, neutropenia (ANC 500), thrombocytopenia, Right neck adenopathy   Plan: Calvin Wolf is feeling better overall; however neck mass is still tender and clinically not changed. I have set him up tentatively for needle biopsy tomorrow per IR. I have requested studies specified by ID as well as pathology and flow. Likely he can transfer to regular floor soon. Will follow with you   Lowella Dell, MD 07/13/2012  12:59 PM

## 2012-07-13 NOTE — Telephone Encounter (Signed)
Lm informed pt that all his appts for 07/19/12 are cancel. i also made he aware that his PET is cancel...td

## 2012-07-13 NOTE — Progress Notes (Signed)
ANTIBIOTIC CONSULT NOTE - F/U  Pharmacy Consult for vancomycin/Zosyn Indication: neutropenic fever  No Known Allergies  Patient Measurements: Height: 5\' 8"  (172.7 cm) Weight: 124 lb (56.246 kg) IBW/kg (Calculated) : 68.4  Vital Signs: Temp: 98.8 F (37.1 C) (07/09 0400) Temp src: Oral (07/09 0400) BP: 102/50 mmHg (07/09 0400) Pulse Rate: 64 (07/09 0400) Intake/Output from previous day: 07/08 0701 - 07/09 0700 In: 2787 [I.V.:1000; IV Piggyback:1075] Out: 1850 [Urine:1850] Intake/Output from this shift: Total I/O In: 1399.5 [I.V.:150; ZOXWR:604; IV Piggyback:612.5] Out: 1850 [Urine:1850]  Labs:  Recent Labs  07/11/12 2010 07/11/12 2011 07/13/12 0505  WBC 1.4*  --  1.7*  HGB 13.5  --  13.1  PLT 124*  --  116*  CREATININE  --  0.82 0.76   Estimated Creatinine Clearance: 117.1 ml/min (by C-G formula based on Cr of 0.76).  Recent Labs  07/13/12 0505  VANCOTROUGH 9.7*     Microbiology: Recent Results (from the past 720 hour(s))  MRSA PCR SCREENING     Status: None   Collection Time    07/11/12  8:47 PM      Result Value Range Status   MRSA by PCR NEGATIVE  NEGATIVE Final   Comment:            The GeneXpert MRSA Assay (FDA     approved for NASAL specimens     only), is one component of a     comprehensive MRSA colonization     surveillance program. It is not     intended to diagnose MRSA     infection nor to guide or     monitor treatment for     MRSA infections.  MALARIA SMEAR     Status: None   Collection Time    07/12/12  9:15 AM      Result Value Range Status   Specimen Description BLOOD   Final   Special Requests Immunocompromised   Final   Malaria Prep     Final   Value: PRESUMPTIVE RESULTS NEGATIVE FOR PLASMODIUM AND OTHER BLOOD PARASITES; BASED ON EXAMINATION OF  THIN SMEARS.  FINAL REPORT PENDING REVIEW OF THICK SMEARS. CRITICAL RESULT CALLED TO, READ BACK BY AND VERIFIED WITH: MICHELLE AT 5409 07/13/12 BY SNOLO   Report Status PENDING    Incomplete    Medical History: Past Medical History  Diagnosis Date  . Anemia age 4    Pt states "I had before anemia"    Medications:  Scheduled:  . doxycycline (VIBRAMYCIN) IV  100 mg Intravenous Q12H  . piperacillin-tazobactam (ZOSYN)  IV  3.375 g Intravenous Q8H  . vancomycin  750 mg Intravenous Q8H   Infusions:    Assessment: 22 yo male from [place] presented to ER with neck pain and a mass that started 3 weeks ago and has gotten progressively worse and painful. Patient also with fever (Tmax 101) and neutropenia to start vancomycin and Zosyn per pharmacy dosing for infectious source vs malignancy at this point.  VT=9.7 mcg/ml after 750mg  IV Q8h.  SCr stable.  Goal of Therapy:  Vancomycin trough level 15-20 mcg/ml  Plan:  1) Increase Vancomycin to 1Gm IV q8h. 2) Continue Zosyn 3.375g IV q8 (extended interval infusion)    Lorenza Evangelist 07/13/2012 6:40 AM

## 2012-07-13 NOTE — H&P (Signed)
Agree 

## 2012-07-13 NOTE — H&P (Signed)
HPI: Calvin Wolf is an 22 y.o. male who is being worked up for fever, neutropenia, and is found to have significant lymphadenopathy in the right neck cervical and supraclavicular chains. IR is requested to try FNA of these LN in hopes for tissue diagnosis. Chart, PMHx, meds reviewed.   Past Medical History:  Past Medical History  Diagnosis Date  . Anemia age 62    Pt states "I had before anemia"    Past Surgical History:  Past Surgical History  Procedure Laterality Date  . No past surgeries      Family History: History reviewed. No pertinent family history.  Social History:  reports that he has been smoking.  He has never used smokeless tobacco. He reports that  drinks alcohol. He reports that he does not use illicit drugs.  Allergies: No Known Allergies  Medications: No prescriptions prior to admission    Please HPI for pertinent positives, otherwise complete 10 system ROS negative.  Physical Exam: BP 102/50  Pulse 50  Temp(Src) 98.6 F (37 C) (Oral)  Resp 21  Ht 5\' 8"  (1.727 m)  Wt 124 lb (56.246 kg)  BMI 18.86 kg/m2  SpO2 97% Body mass index is 18.86 kg/(m^2).   General Appearance:  Alert, cooperative, no distress, appears stated age  Head:  Normocephalic, without obvious abnormality, atraumatic  ENT: Unremarkable  Neck: Supple, symmetrical. Palpable tender right cervical lymphadenopathy  Lungs:   Clear to auscultation bilaterally, no w/r/r, respirations unlabored without use of accessory muscles.  Chest Wall:  No tenderness or deformity  Heart:  Regular rate and rhythm, S1, S2 normal, no murmur, rub or gallop.  Neurologic: Normal affect, no gross deficits.   Results for orders placed during the hospital encounter of 07/11/12 (from the past 48 hour(s))  CBC WITH DIFFERENTIAL     Status: Abnormal   Collection Time    07/11/12  8:10 PM      Result Value Range   WBC 1.4 (*) 4.0 - 10.5 K/uL   Comment: REPEATED TO VERIFY     CRITICAL RESULT CALLED TO,  READ BACK BY AND VERIFIED WITH:     REEVES RN @ 2031 ON 07/11/2012 BY MCREYNOLDS,B   RBC 4.82  4.22 - 5.81 MIL/uL   Hemoglobin 13.5  13.0 - 17.0 g/dL   HCT 40.9  81.1 - 91.4 %   MCV 83.6  78.0 - 100.0 fL   MCH 28.0  26.0 - 34.0 pg   MCHC 33.5  30.0 - 36.0 g/dL   RDW 78.2  95.6 - 21.3 %   Platelets 124 (*) 150 - 400 K/uL   Neutrophils Relative % 38 (*) 43 - 77 %   Lymphocytes Relative 44  12 - 46 %   Monocytes Relative 16 (*) 3 - 12 %   Eosinophils Relative 1  0 - 5 %   Basophils Relative 1  0 - 1 %   Neutro Abs 0.5 (*) 1.7 - 7.7 K/uL   Lymphs Abs 0.7  0.7 - 4.0 K/uL   Monocytes Absolute 0.2  0.1 - 1.0 K/uL   Eosinophils Absolute 0.0  0.0 - 0.7 K/uL   Basophils Absolute 0.0  0.0 - 0.1 K/uL   WBC Morphology INCREASED BANDS (>20% BANDS)     Smear Review LARGE PLATELETS PRESENT    LACTATE DEHYDROGENASE     Status: None   Collection Time    07/11/12  8:10 PM      Result Value Range   LDH 230  94 - 250 U/L  HIV ANTIBODY (ROUTINE TESTING)     Status: None   Collection Time    07/11/12  8:10 PM      Result Value Range   HIV NON REACTIVE  NON REACTIVE  MONONUCLEOSIS SCREEN     Status: None   Collection Time    07/11/12  8:10 PM      Result Value Range   Mono Screen NEGATIVE  NEGATIVE  SAVE SMEAR     Status: None   Collection Time    07/11/12  8:11 PM      Result Value Range   Smear Review SMEAR STAINED AND AVAILABLE FOR REVIEW    COMPREHENSIVE METABOLIC PANEL     Status: None   Collection Time    07/11/12  8:11 PM      Result Value Range   Sodium 135  135 - 145 mEq/L   Potassium 3.8  3.5 - 5.1 mEq/L   Chloride 99  96 - 112 mEq/L   CO2 29  19 - 32 mEq/L   Glucose, Bld 81  70 - 99 mg/dL   BUN 10  6 - 23 mg/dL   Creatinine, Ser 1.61  0.50 - 1.35 mg/dL   Calcium 9.1  8.4 - 09.6 mg/dL   Total Protein 7.2  6.0 - 8.3 g/dL   Albumin 3.7  3.5 - 5.2 g/dL   AST 24  0 - 37 U/L   ALT 16  0 - 53 U/L   Alkaline Phosphatase 70  39 - 117 U/L   Total Bilirubin 0.3  0.3 - 1.2 mg/dL    GFR calc non Af Amer >90  >90 mL/min   GFR calc Af Amer >90  >90 mL/min   Comment:            The eGFR has been calculated     using the CKD EPI equation.     This calculation has not been     validated in all clinical     situations.     eGFR's persistently     <90 mL/min signify     possible Chronic Kidney Disease.  MRSA PCR SCREENING     Status: None   Collection Time    07/11/12  8:47 PM      Result Value Range   MRSA by PCR NEGATIVE  NEGATIVE   Comment:            The GeneXpert MRSA Assay (FDA     approved for NASAL specimens     only), is one component of a     comprehensive MRSA colonization     surveillance program. It is not     intended to diagnose MRSA     infection nor to guide or     monitor treatment for     MRSA infections.  LACTIC ACID, PLASMA     Status: None   Collection Time    07/11/12  9:15 PM      Result Value Range   Lactic Acid, Venous 0.8  0.5 - 2.2 mmol/L  URINALYSIS, ROUTINE W REFLEX MICROSCOPIC     Status: None   Collection Time    07/11/12 10:14 PM      Result Value Range   Color, Urine YELLOW  YELLOW   APPearance CLEAR  CLEAR   Specific Gravity, Urine 1.026  1.005 - 1.030   pH 5.5  5.0 - 8.0   Glucose, UA NEGATIVE  NEGATIVE mg/dL  Hgb urine dipstick NEGATIVE  NEGATIVE   Bilirubin Urine NEGATIVE  NEGATIVE   Ketones, ur NEGATIVE  NEGATIVE mg/dL   Protein, ur NEGATIVE  NEGATIVE mg/dL   Urobilinogen, UA 1.0  0.0 - 1.0 mg/dL   Nitrite NEGATIVE  NEGATIVE   Leukocytes, UA NEGATIVE  NEGATIVE   Comment: MICROSCOPIC NOT DONE ON URINES WITH NEGATIVE PROTEIN, BLOOD, LEUKOCYTES, NITRITE, OR GLUCOSE <1000 mg/dL.  CULTURE, BLOOD (ROUTINE X 2)     Status: None   Collection Time    07/11/12 10:50 PM      Result Value Range   Specimen Description BLOOD RIGHT ARM     Special Requests BOTTLES DRAWN AEROBIC AND ANAEROBIC 5CC     Culture  Setup Time 07/12/2012 05:56     Culture       Value:        BLOOD CULTURE RECEIVED NO GROWTH TO DATE CULTURE WILL  BE HELD FOR 5 DAYS BEFORE ISSUING A FINAL NEGATIVE REPORT   Report Status PENDING    CULTURE, BLOOD (ROUTINE X 2)     Status: None   Collection Time    07/11/12 10:55 PM      Result Value Range   Specimen Description BLOOD LEFT ARM     Special Requests BOTTLES DRAWN AEROBIC AND ANAEROBIC 2CC     Culture  Setup Time 07/12/2012 05:57     Culture       Value:        BLOOD CULTURE RECEIVED NO GROWTH TO DATE CULTURE WILL BE HELD FOR 5 DAYS BEFORE ISSUING A FINAL NEGATIVE REPORT   Report Status PENDING    MALARIA SMEAR     Status: None   Collection Time    07/12/12  9:15 AM      Result Value Range   Specimen Description BLOOD     Special Requests Immunocompromised     Malaria Prep       Value: No Plasmodium or Other Blood Parasites Seen on Thick or Thin Smears For persons strongly suspected of having a blood parasite,but have negative smears, it is recommended that blood films be repeated approximately every 12 to 24 hours for 3 consecutive      days.   Report Status 07/13/2012 FINAL    RPR     Status: None   Collection Time    07/12/12 10:35 AM      Result Value Range   RPR NON REACTIVE  NON REACTIVE  EBV AB TO VIRAL CAPSID AG PNL, IGG+IGM     Status: Abnormal   Collection Time    07/12/12 10:35 AM      Result Value Range   EBV VCA IgG >750.0 (*) <18.0 U/mL   Comment: (NOTE)     Reference Range:       <18.0 U/mL = Negative                       18.0-21.9 U/mL = Equivocal                          >=22.0 U/mL = Positive   EBV VCA IgM <10.0  <36.0 U/mL   Comment: (NOTE)     Reference Range:       <36.0 U/mL = Negative                       36.0-43.9 U/mL = Equivocal                          >=  44.0 U/mL = Positive           Clinical Stage            VCA IgG   VCA IgM      EA    EBV NA           Susceptibility               -         -         -       -           Very Early Infection        +/-       +/-        -       -           Established Infection        +         +        +/-       -           Recent Infection             +         +        +/-     +/-           Past Infection               +         -        +/-      +                                                                                     +/- means positive or negative (not weak)     High persisting antibody levels may be present in Burkitt's lymphoma     and nasopharyngeal carcinoma.  CBC WITH DIFFERENTIAL     Status: Abnormal   Collection Time    07/13/12  5:05 AM      Result Value Range   WBC 1.7 (*) 4.0 - 10.5 K/uL   RBC 4.62  4.22 - 5.81 MIL/uL   Hemoglobin 13.1  13.0 - 17.0 g/dL   HCT 11.9 (*) 14.7 - 82.9 %   MCV 84.0  78.0 - 100.0 fL   MCH 28.4  26.0 - 34.0 pg   MCHC 33.8  30.0 - 36.0 g/dL   RDW 56.2  13.0 - 86.5 %   Platelets 116 (*) 150 - 400 K/uL   Comment: SPECIMEN CHECKED FOR CLOTS     REPEATED TO VERIFY     PLATELET COUNT CONFIRMED BY SMEAR   Neutrophils Relative % 29 (*) 43 - 77 %   Lymphocytes Relative 46  12 - 46 %   Monocytes Relative 21 (*) 3 - 12 %   Eosinophils Relative 3  0 - 5 %   Basophils Relative 1  0 - 1 %   Neutro Abs 0.5 (*) 1.7 - 7.7 K/uL   Lymphs Abs 0.7  0.7 - 4.0 K/uL   Monocytes Absolute 0.4  0.1 - 1.0 K/uL   Eosinophils Absolute 0.1  0.0 - 0.7 K/uL   Basophils Absolute 0.0  0.0 - 0.1 K/uL   Smear Review LARGE PLATELETS PRESENT    COMPREHENSIVE METABOLIC PANEL     Status: None   Collection Time    07/13/12  5:05 AM      Result Value Range   Sodium 137  135 - 145 mEq/L   Potassium 3.5  3.5 - 5.1 mEq/L   Chloride 100  96 - 112 mEq/L   CO2 29  19 - 32 mEq/L   Glucose, Bld 99  70 - 99 mg/dL   BUN 7  6 - 23 mg/dL   Creatinine, Ser 4.78  0.50 - 1.35 mg/dL   Calcium 9.0  8.4 - 29.5 mg/dL   Total Protein 7.3  6.0 - 8.3 g/dL   Albumin 3.6  3.5 - 5.2 g/dL   AST 21  0 - 37 U/L   ALT 15  0 - 53 U/L   Alkaline Phosphatase 67  39 - 117 U/L   Total Bilirubin 0.4  0.3 - 1.2 mg/dL   GFR calc non Af Amer >90  >90 mL/min   GFR calc Af Amer >90  >90 mL/min    Comment:            The eGFR has been calculated     using the CKD EPI equation.     This calculation has not been     validated in all clinical     situations.     eGFR's persistently     <90 mL/min signify     possible Chronic Kidney Disease.  VANCOMYCIN, TROUGH     Status: Abnormal   Collection Time    07/13/12  5:05 AM      Result Value Range   Vancomycin Tr 9.7 (*) 10.0 - 20.0 ug/mL  PATHOLOGIST SMEAR REVIEW     Status: None   Collection Time    07/13/12  5:05 AM      Result Value Range   Path Review Reviewed By Havery Moros, M.D.     Comment: 07.09.14     LEUKOPENIA WITH NEUTROPENIA AND     REACTIVE LYMPHOCYTES,     THROMBOCYTOPENIA.  TSH     Status: None   Collection Time    07/13/12  8:45 AM      Result Value Range   TSH 2.843  0.350 - 4.500 uIU/mL  CORTISOL     Status: None   Collection Time    07/13/12  8:45 AM      Result Value Range   Cortisol, Plasma 4.2     Comment: (NOTE)     AM:  4.3 - 22.4 ug/dL     PM:  3.1 - 62.1 ug/dL   Ct Soft Tissue Neck W Contrast  07/12/2012   *RADIOLOGY REPORT*  Clinical Data:  Fever and neutropenia.  Mass in the right side of the neck.  Mass is painful to touch.  CT NECK, CHEST, ABDOMEN AND PELVIS WITH CONTRAST  Technique:  Multidetector CT imaging of the neck, chest, abdomen and pelvis was performed using the standard protocol following the bolus administration of intravenous contrast.  Contrast: 50mL OMNIPAQUE IOHEXOL 300 MG/ML  SOLN, OMNIPAQUE IOHEXOL 300 MG/ML  SOLN  Comparison:   None.  CT NECK  Findings:  There is right-sided cervical and supraclavicular lymphadenopathy with enhancing lymph nodes demonstrated IIA, IIB, III, IV and V levels.  Cervical lymph nodes measure up to about 1.1 x 2.5 cm diameter.  Supraclavicular lymph nodes measure up to about 0.9 x 1.4 cm.  No significant lymphadenopathy is demonstrated on the left.  Changes could be due to infectious or inflammatory process, lymphoma, or metastasis.  Tonsillar  adenoidal tissues are not enlarged.  No mucosal space or prevertebral lesions demonstrated.  Salivary glands appear homogeneous and symmetrical. Carotid and jugular vessels are patent and not effaced.  Laryngeal structures appear intact.  Cervical airway appears patent.  Thyroid gland appears homogeneous.  Normal alignment of the cervical vertebrae.  No destructive bone lesions appreciated.  IMPRESSION: Fairly extensive lymphadenopathy along the right cervical and supraclavicular regions.  Changes could represent lymphoma, infectious or inflammatory process, or metastasis.  CT CHEST  Findings: Right-sided supraclavicular lymphadenopathy as previously discussed.  See above.  No significant axillary, mediastinal, or hilar lymphadenopathy on either side.  Normal heart size.  Normal caliber thoracic aorta.  No mediastinal mass lesion.  Esophagus is decompressed.  Small calcified lymph node in the middle mediastinum to the right of the right mainstem bronchus.  No pleural effusions. Lungs appear clear.  No focal lung mass, consolidation, airspace disease, or interstitial infiltration.  Airways appear patent.  No pneumothorax.  Normal alignment of the thoracic spine.  No destructive bone lesions are appreciated.  IMPRESSION: Right supraclavicular lymphadenopathy.  Chest is otherwise negative.  CT ABDOMEN AND PELVIS  Findings: Spleen is mildly enlarged, measuring about 5.8 x 10.5 x 13.6 cm.  Normal homogeneous parenchymal echotexture.  The liver, gallbladder, pancreas, adrenal glands, kidneys, abdominal aorta, and inferior vena cava are unremarkable.  Mesenteric and retroperitoneal lymph nodes are present but without pathologic enlargement.  The stomach, small bowel, and colon are decompressed. No free air or free fluid in the abdomen.  Abdominal wall musculature appears intact.  Pelvis:  The prostate gland is not enlarged.  Bladder wall is not thickened.  Stool filled rectosigmoid colon without diverticulitis. The  appendix is normal.  No free or loculated pelvic fluid collections.  No significant lymphadenopathy in the pelvis or groin regions.  Visualized growing nodes are not pathologically enlarged. Normal alignment of the lumbar vertebra.  No destructive bone lesions appreciated.  IMPRESSION: Mild splenic enlargement.  No significant lymphadenopathy in the abdomen or pelvis.   Original Report Authenticated By: Burman Nieves, M.D.   Ct Chest W Contrast  07/12/2012   *RADIOLOGY REPORT*  Clinical Data:  Fever and neutropenia.  Mass in the right side of the neck.  Mass is painful to touch.  CT NECK, CHEST, ABDOMEN AND PELVIS WITH CONTRAST  Technique:  Multidetector CT imaging of the neck, chest, abdomen and pelvis was performed using the standard protocol following the bolus administration of intravenous contrast.  Contrast: 50mL OMNIPAQUE IOHEXOL 300 MG/ML  SOLN, OMNIPAQUE IOHEXOL 300 MG/ML  SOLN  Comparison:   None.  CT NECK  Findings:  There is right-sided cervical and supraclavicular lymphadenopathy with enhancing lymph nodes demonstrated IIA, IIB, III, IV and V levels.  Cervical lymph nodes measure up to about 1.1 x 2.5 cm diameter.  Supraclavicular lymph nodes measure up to about 0.9 x 1.4 cm.  No significant lymphadenopathy is demonstrated on the left.  Changes could be due to infectious or inflammatory process, lymphoma, or metastasis.  Tonsillar adenoidal tissues are not enlarged.  No mucosal space or prevertebral lesions demonstrated.  Salivary glands appear homogeneous and symmetrical. Carotid and jugular vessels are patent and not effaced.  Laryngeal structures appear intact.  Cervical airway appears patent.  Thyroid gland appears homogeneous.  Normal alignment of the cervical vertebrae.  No destructive bone lesions appreciated.  IMPRESSION: Fairly extensive lymphadenopathy along the right cervical and supraclavicular regions.  Changes could represent lymphoma, infectious or inflammatory process, or  metastasis.  CT CHEST  Findings: Right-sided supraclavicular lymphadenopathy as previously discussed.  See above.  No significant axillary, mediastinal, or hilar lymphadenopathy on either side.  Normal heart size.  Normal caliber thoracic aorta.  No mediastinal mass lesion.  Esophagus is decompressed.  Small calcified lymph node in the middle mediastinum to the right of the right mainstem bronchus.  No pleural effusions. Lungs appear clear.  No focal lung mass, consolidation, airspace disease, or interstitial infiltration.  Airways appear patent.  No pneumothorax.  Normal alignment of the thoracic spine.  No destructive bone lesions are appreciated.  IMPRESSION: Right supraclavicular lymphadenopathy.  Chest is otherwise negative.  CT ABDOMEN AND PELVIS  Findings: Spleen is mildly enlarged, measuring about 5.8 x 10.5 x 13.6 cm.  Normal homogeneous parenchymal echotexture.  The liver, gallbladder, pancreas, adrenal glands, kidneys, abdominal aorta, and inferior vena cava are unremarkable.  Mesenteric and retroperitoneal lymph nodes are present but without pathologic enlargement.  The stomach, small bowel, and colon are decompressed. No free air or free fluid in the abdomen.  Abdominal wall musculature appears intact.  Pelvis:  The prostate gland is not enlarged.  Bladder wall is not thickened.  Stool filled rectosigmoid colon without diverticulitis. The appendix is normal.  No free or loculated pelvic fluid collections.  No significant lymphadenopathy in the pelvis or groin regions.  Visualized growing nodes are not pathologically enlarged. Normal alignment of the lumbar vertebra.  No destructive bone lesions appreciated.  IMPRESSION: Mild splenic enlargement.  No significant lymphadenopathy in the abdomen or pelvis.   Original Report Authenticated By: Burman Nieves, M.D.   Ct Abdomen Pelvis W Contrast  07/12/2012   *RADIOLOGY REPORT*  Clinical Data:  Fever and neutropenia.  Mass in the right side of the neck.   Mass is painful to touch.  CT NECK, CHEST, ABDOMEN AND PELVIS WITH CONTRAST  Technique:  Multidetector CT imaging of the neck, chest, abdomen and pelvis was performed using the standard protocol following the bolus administration of intravenous contrast.  Contrast: 50mL OMNIPAQUE IOHEXOL 300 MG/ML  SOLN, OMNIPAQUE IOHEXOL 300 MG/ML  SOLN  Comparison:   None.  CT NECK  Findings:  There is right-sided cervical and supraclavicular lymphadenopathy with enhancing lymph nodes demonstrated IIA, IIB, III, IV and V levels.  Cervical lymph nodes measure up to about 1.1 x 2.5 cm diameter.  Supraclavicular lymph nodes measure up to about 0.9 x 1.4 cm.  No significant lymphadenopathy is demonstrated on the left.  Changes could be due to infectious or inflammatory process, lymphoma, or metastasis.  Tonsillar adenoidal tissues are not enlarged.  No mucosal space or prevertebral lesions demonstrated.  Salivary glands appear homogeneous and symmetrical. Carotid and jugular vessels are patent and not effaced.  Laryngeal structures appear intact.  Cervical airway appears patent.  Thyroid gland appears homogeneous.  Normal alignment of the cervical vertebrae.  No destructive bone lesions appreciated.  IMPRESSION: Fairly extensive lymphadenopathy along the right cervical and supraclavicular regions.  Changes could represent lymphoma, infectious or inflammatory process, or metastasis.  CT CHEST  Findings: Right-sided supraclavicular lymphadenopathy as previously discussed.  See above.  No significant axillary, mediastinal, or hilar lymphadenopathy on either side.  Normal heart size.  Normal caliber thoracic aorta.  No  mediastinal mass lesion.  Esophagus is decompressed.  Small calcified lymph node in the middle mediastinum to the right of the right mainstem bronchus.  No pleural effusions. Lungs appear clear.  No focal lung mass, consolidation, airspace disease, or interstitial infiltration.  Airways appear patent.  No  pneumothorax.  Normal alignment of the thoracic spine.  No destructive bone lesions are appreciated.  IMPRESSION: Right supraclavicular lymphadenopathy.  Chest is otherwise negative.  CT ABDOMEN AND PELVIS  Findings: Spleen is mildly enlarged, measuring about 5.8 x 10.5 x 13.6 cm.  Normal homogeneous parenchymal echotexture.  The liver, gallbladder, pancreas, adrenal glands, kidneys, abdominal aorta, and inferior vena cava are unremarkable.  Mesenteric and retroperitoneal lymph nodes are present but without pathologic enlargement.  The stomach, small bowel, and colon are decompressed. No free air or free fluid in the abdomen.  Abdominal wall musculature appears intact.  Pelvis:  The prostate gland is not enlarged.  Bladder wall is not thickened.  Stool filled rectosigmoid colon without diverticulitis. The appendix is normal.  No free or loculated pelvic fluid collections.  No significant lymphadenopathy in the pelvis or groin regions.  Visualized growing nodes are not pathologically enlarged. Normal alignment of the lumbar vertebra.  No destructive bone lesions appreciated.  IMPRESSION: Mild splenic enlargement.  No significant lymphadenopathy in the abdomen or pelvis.   Original Report Authenticated By: Burman Nieves, M.D.    Assessment/Plan Fever, neutropenia of uncertain etiology Cervical lymphadenopathy Discussed plan for US guided needle biopsy of rt cervical LN as requested. Explained the procedure, risks, complications. I don't think he will need sedation and he should do fine with local anesthesia alone, but I will make him NPO p MN just in case. Labs reviewed. Consent signed in chart  Brayton El PA-C 07/13/2012, 1:38 PM

## 2012-07-14 ENCOUNTER — Inpatient Hospital Stay (HOSPITAL_COMMUNITY): Payer: Managed Care, Other (non HMO)

## 2012-07-14 LAB — CMV IGM: CMV IgM: <8 AU/mL (ref <30.00)

## 2012-07-14 LAB — BASIC METABOLIC PANEL
BUN: 6 mg/dL (ref 6–23)
Chloride: 101 mEq/L (ref 96–112)
Creatinine, Ser: 0.75 mg/dL (ref 0.50–1.35)
GFR calc Af Amer: >90 mL/min (ref >90)
GFR calc non Af Amer: >90 mL/min (ref >90)
Potassium: 3.6 mEq/L (ref 3.5–5.1)

## 2012-07-14 LAB — CBC WITH DIFFERENTIAL/PLATELET
Basophils Absolute: 0 10*3/uL (ref 0.0–0.1)
Eosinophils Absolute: 0.1 10*3/uL (ref 0.0–0.7)
Lymphocytes Relative: 54 % — ABNORMAL HIGH (ref 12–46)
MCHC: 33.8 g/dL (ref 30.0–36.0)
Monocytes Relative: 14 % — ABNORMAL HIGH (ref 3–12)
Platelets: 116 10*3/uL — ABNORMAL LOW (ref 150–400)
RDW: 12.6 % (ref 11.5–15.5)
WBC: 1.3 10*3/uL — CL (ref 4.0–10.5)

## 2012-07-14 LAB — QUANTIFERON TB GOLD ASSAY (BLOOD)
Mitogen value: >10 IU/mL
Quantiferon Nil Value: 0.8 IU/mL
TB Ag value: 0.83 IU/mL
TB Antigen Minus Nil Value: 0.03 IU/mL

## 2012-07-14 LAB — TOXOPLASMA GONDII ANTIBODY, IGM: Toxoplasma Antibody- IgM: <3 IU/mL (ref <8.0)

## 2012-07-14 NOTE — Progress Notes (Signed)
Calvin Wolf   DOB:[birth year]   ZO#:[id number]   JWJ#:[id number]  Subjective: woken from sleep;  A bit surly this AM, but I'm told he was up to 3 AM last night w friends visiting. Understands he will have FNA today. Transfer orders to regular ward in place   Objective: woung middle [place] man examined in bed Filed Vitals:   07/14/12 0430  BP: 103/45  Pulse: 56  Temp:   Resp: 16    Body mass index is 18.86 kg/(m^2).  Intake/Output Summary (Last 24 hours) at 07/14/12 0725 Last data filed at 07/14/12 0600  Gross per 24 hour  Intake   2925 ml  Output      0 ml  Net   2925 ml    Exam not repeated  CBG (last 3)  No results found for this basename: GLUCAP,  in the last 72 hours   Labs:  Lab Results  Component Value Date   WBC 1.3* 07/14/2012   HGB 13.3 07/14/2012   HCT 39.4 07/14/2012   MCV 83.8 07/14/2012   PLT 116* 07/14/2012   NEUTROABS 0.4* 07/14/2012    @LASTCHEMISTRY @  Urine Studies No results found for this basename: UACOL, UAPR, USPG, UPH, UTP, UGL, UKET, UBIL, UHGB, UNIT, UROB, ULEU, UEPI, UWBC, URBC, UBAC, CAST, CRYS, UCOM, BILUA,  in the last 72 hours  Basic Metabolic Panel:  Recent Labs Lab 07/11/12 2011 07/13/12 0505 07/14/12 0320  NA 135 137 137  K 3.8 3.5 3.6  CL 99 100 101  CO2 29 29 28   GLUCOSE 81 99 86  BUN 10 7 6   CREATININE 0.82 0.76 0.75  CALCIUM 9.1 9.0 8.9   GFR Estimated Creatinine Clearance: 117.1 ml/min (by C-G formula based on Cr of 0.75). Liver Function Tests:  Recent Labs Lab 07/11/12 2011 07/13/12 0505  AST 24 21  ALT 16 15  ALKPHOS 70 67  BILITOT 0.3 0.4  PROT 7.2 7.3  ALBUMIN 3.7 3.6   No results found for this basename: LIPASE, AMYLASE,  in the last 168 hours No results found for this basename: AMMONIA,  in the last 168 hours Coagulation profile No results found for this basename: INR, PROTIME,  in the last 168 hours  CBC:  Recent Labs Lab 07/11/12 2010 07/13/12 0505 07/14/12 0320  WBC 1.4* 1.7* 1.3*   NEUTROABS 0.5* 0.5* 0.4*  HGB 13.5 13.1 13.3  HCT 40.3 38.8* 39.4  MCV 83.6 84.0 83.8  PLT 124* 116* 116*   Cardiac Enzymes: No results found for this basename: CKTOTAL, CKMB, CKMBINDEX, TROPONINI,  in the last 168 hours BNP: No components found with this basename: POCBNP,  CBG: No results found for this basename: GLUCAP,  in the last 168 hours D-Dimer No results found for this basename: DDIMER,  in the last 72 hours Hgb A1c No results found for this basename: HGBA1C,  in the last 72 hours Lipid Profile No results found for this basename: CHOL, HDL, LDLCALC, TRIG, CHOLHDL, LDLDIRECT,  in the last 72 hours Thyroid function studies  Recent Labs  07/13/12 0845  TSH 2.843   Anemia work up No results found for this basename: VITAMINB12, FOLATE, FERRITIN, TIBC, IRON, RETICCTPCT,  in the last 72 hours Microbiology Recent Results (from the past 240 hour(s))  MRSA PCR SCREENING     Status: None   Collection Time    07/11/12  8:47 PM      Result Value Range Status   MRSA by PCR NEGATIVE  NEGATIVE Final   Comment:  The GeneXpert MRSA Assay (FDA     approved for NASAL specimens     only), is one component of a     comprehensive MRSA colonization     surveillance program. It is not     intended to diagnose MRSA     infection nor to guide or     monitor treatment for     MRSA infections.  CULTURE, BLOOD (ROUTINE X 2)     Status: None   Collection Time    07/11/12 10:50 PM      Result Value Range Status   Specimen Description BLOOD RIGHT ARM   Final   Special Requests BOTTLES DRAWN AEROBIC AND ANAEROBIC 5CC   Final   Culture  Setup Time 07/12/2012 05:56   Final   Culture     Final   Value:        BLOOD CULTURE RECEIVED NO GROWTH TO DATE CULTURE WILL BE HELD FOR 5 DAYS BEFORE ISSUING A FINAL NEGATIVE REPORT   Report Status PENDING   Incomplete  CULTURE, BLOOD (ROUTINE X 2)     Status: None   Collection Time    07/11/12 10:55 PM      Result Value Range Status    Specimen Description BLOOD LEFT ARM   Final   Special Requests BOTTLES DRAWN AEROBIC AND ANAEROBIC 2CC   Final   Culture  Setup Time 07/12/2012 05:57   Final   Culture     Final   Value:        BLOOD CULTURE RECEIVED NO GROWTH TO DATE CULTURE WILL BE HELD FOR 5 DAYS BEFORE ISSUING A FINAL NEGATIVE REPORT   Report Status PENDING   Incomplete  MALARIA SMEAR     Status: None   Collection Time    07/12/12  9:15 AM      Result Value Range Status   Specimen Description BLOOD   Final   Special Requests Immunocompromised   Final   Malaria Prep     Final   Value: No Plasmodium or Other Blood Parasites Seen on Thick or Thin Smears For persons strongly suspected of having a blood parasite,but have negative smears, it is recommended that blood films be repeated approximately every 12 to 24 hours for 3 consecutive      days.   Report Status 07/13/2012 FINAL   Final      Studies:  No results found.  Assessment: 22 y.o. with fever, neutropenia (ANC 500), thrombocytopenia, Right neck adenopathy  (1) ID workup in progress  (2) for FNA of neck mass today  (3) afebrile but bradycardic (baseline?) with persistent neutropenia and thrombocytopenia   Plan: FNA cannot provide lymph node architecture and likely not enough material for flow, but it just may give [place] the diagnosis if it's an infectious process. If nondiagnostic we probably will have to move to excisional biopsy.  Will follow with you   Lowella Dell, MD 07/14/2012  7:25 AM

## 2012-07-14 NOTE — Procedures (Signed)
Successful RT CERVCIAL ADENOPATHY 18 G CORE BX NO COMP STABLE PATH PENDING

## 2012-07-14 NOTE — Progress Notes (Signed)
[facility] PROGRESS NOTE  Calvin Wolf WUJ:[id number] DOB: [birth year] DOA: 07/11/2012 PCP: No primary provider on file.  Assessment/Plan: 1-Neutropenic fever: with enlarged lymph node. Concerns for infectious etiology vs malignancy.  -continue zosyn and doxycycline day 4. -Vancomycin stopped 7-9. -Ehrlichia, Cat scratch,  quantiferon/TB  Pending. -RPR non reactive, malaria smear negative, EBV IgG more 750, toxo negative, RMSF less than 0.06, HIV RNA less than 20, CMV IgM less than 8 -Blood culture pending.  -For lymph node Biopsy today.   2-Thrombocytopenia: most likely secondary to infection/ongoing process.  -will avoid heparin products and follow trend.   3-Soft BP: Continue to monitor. Lactic acid 0.8. cortisol level 4.2.  TSH. Continue with IV fluids.  4-DVT:SCD's   Code Status: Full Code.  Family Communication: Care discussed with patient.  Disposition Plan: Transfer to regular bed.    Consultants:  ID  Onc  Procedures:  none  Antibiotics: Zenaida Niece 7-7 Zosyn 7-7 Doxycycline 7-8   HPI/Subjective: Feeling well this morning, was sitting in the chair. No pain, no complaints.   Objective: Filed Vitals:   07/13/12 2015 07/14/12 0015 07/14/12 0400 07/14/12 0430  BP: 100/53 108/53  103/45  Pulse: 92 72  56  Temp: 98.8 F (37.1 C) 99 F (37.2 C) 99.4 F (37.4 C)   TempSrc:  Oral Oral   Resp: 16 16  16   Height:      Weight:      SpO2: 100% 99%  100%    Intake/Output Summary (Last 24 hours) at 07/14/12 0759 Last data filed at 07/14/12 0600  Gross per 24 hour  Intake   2925 ml  Output      0 ml  Net   2925 ml   Filed Weights   07/11/12 2100  Weight: 56.246 kg (124 lb)    Exam:   General:  No distress.   Cardiovascular: S 1, S 2 RRR  Respiratory: CTA  Abdomen: Bs present, soft, NT  Musculoskeletal: no edema.   Data Reviewed: Basic Metabolic Panel:  Recent Labs Lab 07/11/12 2011 07/13/12 0505 07/14/12 0320  NA 135 137 137  K  3.8 3.5 3.6  CL 99 100 101  CO2 29 29 28   GLUCOSE 81 99 86  BUN 10 7 6   CREATININE 0.82 0.76 0.75  CALCIUM 9.1 9.0 8.9   Liver Function Tests:  Recent Labs Lab 07/11/12 2011 07/13/12 0505  AST 24 21  ALT 16 15  ALKPHOS 70 67  BILITOT 0.3 0.4  PROT 7.2 7.3  ALBUMIN 3.7 3.6   No results found for this basename: LIPASE, AMYLASE,  in the last 168 hours No results found for this basename: AMMONIA,  in the last 168 hours CBC:  Recent Labs Lab 07/11/12 2010 07/13/12 0505 07/14/12 0320  WBC 1.4* 1.7* 1.3*  NEUTROABS 0.5* 0.5* 0.4*  HGB 13.5 13.1 13.3  HCT 40.3 38.8* 39.4  MCV 83.6 84.0 83.8  PLT 124* 116* 116*   Cardiac Enzymes: No results found for this basename: CKTOTAL, CKMB, CKMBINDEX, TROPONINI,  in the last 168 hours BNP (last 3 results) No results found for this basename: PROBNP,  in the last 8760 hours CBG: No results found for this basename: GLUCAP,  in the last 168 hours  Recent Results (from the past 240 hour(s))  MRSA PCR SCREENING     Status: None   Collection Time    07/11/12  8:47 PM      Result Value Range Status   MRSA by PCR NEGATIVE  NEGATIVE  Final   Comment:            The GeneXpert MRSA Assay (FDA     approved for NASAL specimens     only), is one component of a     comprehensive MRSA colonization     surveillance program. It is not     intended to diagnose MRSA     infection nor to guide or     monitor treatment for     MRSA infections.  CULTURE, BLOOD (ROUTINE X 2)     Status: None   Collection Time    07/11/12 10:50 PM      Result Value Range Status   Specimen Description BLOOD RIGHT ARM   Final   Special Requests BOTTLES DRAWN AEROBIC AND ANAEROBIC 5CC   Final   Culture  Setup Time 07/12/2012 05:56   Final   Culture     Final   Value:        BLOOD CULTURE RECEIVED NO GROWTH TO DATE CULTURE WILL BE HELD FOR 5 DAYS BEFORE ISSUING A FINAL NEGATIVE REPORT   Report Status PENDING   Incomplete  CULTURE, BLOOD (ROUTINE X 2)     Status:  None   Collection Time    07/11/12 10:55 PM      Result Value Range Status   Specimen Description BLOOD LEFT ARM   Final   Special Requests BOTTLES DRAWN AEROBIC AND ANAEROBIC 2CC   Final   Culture  Setup Time 07/12/2012 05:57   Final   Culture     Final   Value:        BLOOD CULTURE RECEIVED NO GROWTH TO DATE CULTURE WILL BE HELD FOR 5 DAYS BEFORE ISSUING A FINAL NEGATIVE REPORT   Report Status PENDING   Incomplete  MALARIA SMEAR     Status: None   Collection Time    07/12/12  9:15 AM      Result Value Range Status   Specimen Description BLOOD   Final   Special Requests Immunocompromised   Final   Malaria Prep     Final   Value: No Plasmodium or Other Blood Parasites Seen on Thick or Thin Smears For persons strongly suspected of having a blood parasite,but have negative smears, it is recommended that blood films be repeated approximately every 12 to 24 hours for 3 consecutive      days.   Report Status 07/13/2012 FINAL   Final     Studies: No results found.  Scheduled Meds: . doxycycline (VIBRAMYCIN) IV  100 mg Intravenous Q12H  . feeding supplement  237 mL Oral BID BM  . piperacillin-tazobactam (ZOSYN)  IV  3.375 g Intravenous Q8H   Continuous Infusions: . sodium chloride 100 mL/hr at 07/13/12 2305    Principal Problem:   Neutropenic fever Active Problems:   Lump in neck   Immigrant with language difficulty    Time spent: 35 minutes.     Calvin Wolf  [facility] Pager [phone]. If 7PM-7AM, please contact night-coverage at [email or web address], password [facility] 07/14/2012, 7:59 AM  LOS: 3 days

## 2012-07-14 NOTE — Progress Notes (Signed)
INFECTIOUS DISEASE PROGRESS NOTE  ID: Calvin Wolf is a 22 y.o. male with  Principal Problem:   Neutropenic fever Active Problems:   Lump in neck   Immigrant with language difficulty  Subjective: Resting queitly  Abtx:  Anti-infectives   Start     Dose/Rate Route Frequency Ordered Stop   07/13/12 1200  vancomycin (VANCOCIN) IVPB 1000 mg/200 mL premix  Status:  Discontinued     1,000 mg 200 mL/hr over 60 Minutes Intravenous Every 8 hours 07/13/12 0645 07/13/12 1512   07/12/12 1000  doxycycline (VIBRAMYCIN) 100 mg in dextrose 5 % 250 mL IVPB     100 mg 125 mL/hr over 120 Minutes Intravenous Every 12 hours 07/12/12 0916     07/11/12 2200  vancomycin (VANCOCIN) IVPB 750 mg/150 ml premix  Status:  Discontinued     750 mg 150 mL/hr over 60 Minutes Intravenous Every 8 hours 07/11/12 2112 07/13/12 0645   07/11/12 2200  piperacillin-tazobactam (ZOSYN) IVPB 3.375 g     3.375 g 12.5 mL/hr over 240 Minutes Intravenous Every 8 hours 07/11/12 2112        Medications:  Scheduled: . doxycycline (VIBRAMYCIN) IV  100 mg Intravenous Q12H  . feeding supplement  237 mL Oral BID BM  . piperacillin-tazobactam (ZOSYN)  IV  3.375 g Intravenous Q8H    Objective: Vital signs in last 24 hours: Temp:  [98.1 F (36.7 C)-99.4 F (37.4 C)] 98.3 F (36.8 C) (07/10 1200) Pulse Rate:  [56-92] 56 (07/10 0430) Resp:  [16] 16 (07/10 0430) BP: (100-114)/(45-61) 103/45 mmHg (07/10 0430) SpO2:  [99 %-100 %] 100 % (07/10 0430)   General appearance: no distress  Lab Results  Recent Labs  07/13/12 0505 07/14/12 0320  WBC 1.7* 1.3*  HGB 13.1 13.3  HCT 38.8* 39.4  NA 137 137  K 3.5 3.6  CL 100 101  CO2 29 28  BUN 7 6  CREATININE 0.76 0.75   Liver Panel  Recent Labs  07/11/12 2011 07/13/12 0505  PROT 7.2 7.3  ALBUMIN 3.7 3.6  AST 24 21  ALT 16 15  ALKPHOS 70 67  BILITOT 0.3 0.4   Sedimentation Rate No results found for this basename: ESRSEDRATE,  in the last 72  hours C-Reactive Protein No results found for this basename: CRP,  in the last 72 hours  Microbiology: Recent Results (from the past 240 hour(s))  MRSA PCR SCREENING     Status: None   Collection Time    07/11/12  8:47 PM      Result Value Range Status   MRSA by PCR NEGATIVE  NEGATIVE Final   Comment:            The GeneXpert MRSA Assay (FDA     approved for NASAL specimens     only), is one component of a     comprehensive MRSA colonization     surveillance program. It is not     intended to diagnose MRSA     infection nor to guide or     monitor treatment for     MRSA infections.  CULTURE, BLOOD (ROUTINE X 2)     Status: None   Collection Time    07/11/12 10:50 PM      Result Value Range Status   Specimen Description BLOOD RIGHT ARM   Final   Special Requests BOTTLES DRAWN AEROBIC AND ANAEROBIC 5CC   Final   Culture  Setup Time 07/12/2012 05:56   Final  Culture     Final   Value:        BLOOD CULTURE RECEIVED NO GROWTH TO DATE CULTURE WILL BE HELD FOR 5 DAYS BEFORE ISSUING A FINAL NEGATIVE REPORT   Report Status PENDING   Incomplete  CULTURE, BLOOD (ROUTINE X 2)     Status: None   Collection Time    07/11/12 10:55 PM      Result Value Range Status   Specimen Description BLOOD LEFT ARM   Final   Special Requests BOTTLES DRAWN AEROBIC AND ANAEROBIC 2CC   Final   Culture  Setup Time 07/12/2012 05:57   Final   Culture     Final   Value:        BLOOD CULTURE RECEIVED NO GROWTH TO DATE CULTURE WILL BE HELD FOR 5 DAYS BEFORE ISSUING A FINAL NEGATIVE REPORT   Report Status PENDING   Incomplete  MALARIA SMEAR     Status: None   Collection Time    07/12/12  9:15 AM      Result Value Range Status   Specimen Description BLOOD   Final   Special Requests Immunocompromised   Final   Malaria Prep     Final   Value: No Plasmodium or Other Blood Parasites Seen on Thick or Thin Smears For persons strongly suspected of having a blood parasite,but have negative smears, it is recommended  that blood films be repeated approximately every 12 to 24 hours for 3 consecutive      days.   Report Status 07/13/2012 FINAL   Final    Studies/Results: US Biopsy  07/14/2012   *RADIOLOGY REPORT*  Clinical Data: Right cervical adenopathy  ULTRASOUND RIGHT CERVICAL ADENOPATHY 18 GAUGE CORE BIOPSY  Date:  07/14/2012 10:55:00  Radiologist:  M. Ruel Favors, M.D.  Medications:  1% lidocaine locally  Guidance:  Ultrasound  Complications:  No immediate  PROCEDURE/FINDINGS:  Informed consent was obtained from the patient following explanation of the procedure, risks, benefits and alternatives. The patient understands, agrees and consents for the procedure. All questions were addressed.  A time out was performed.  Maximal barrier sterile technique utilized including caps, mask, sterile gowns, sterile gloves, large sterile drape, hand hygiene, and betadine  Previous imaging reviewed.  Preliminary ultrasound performed. Right cervical adenopathy demonstrated.  Overlying skin marked. Under sterile conditions and local anesthesia, an 18 gauge core needle was advanced into the right cervical adenopathy.  Several 1 cm 18 gauge core biopsies were obtained of the abnormal adenopathy under direct ultrasound.  These biopsies were placed in saline. Post procedure imaging demonstrates no evidence of hemorrhage or hematoma.  The patient tolerated the biopsy well.  IMPRESSION: Successful right cervical adenopathy 18 gauge core biopsies   Original Report Authenticated By: Judie Petit. Miles Costain, M.D.     Assessment/Plan: Fever, LAN   Await serologies (cat scratch, Ehrlichia) Await pathology, Cx's, silver stain.  Negative to date: TSH, Malaria, RPR, EBV VCA IgM, RMSF, HIV Ab and RNA, CMV IgM, Toxo, quantiferon He appears to have defervesced. Not on any scheduled anti-pyretics Could he be transferred out of unit? Consider stop zosyn soon?  Total days of antibiotics: 4 (doxy/zosyn)          Johny Sax Infectious  Diseases (pager) [phone] [email or web address] 07/14/2012, 2:16 PM   LOS: 3 days

## 2012-07-15 ENCOUNTER — Other Ambulatory Visit: Payer: Self-pay | Admitting: Oncology

## 2012-07-15 DIAGNOSIS — D709 Neutropenia, unspecified: Secondary | ICD-10-CM

## 2012-07-15 LAB — CBC WITH DIFFERENTIAL/PLATELET
Basophils Absolute: 0 10*3/uL (ref 0.0–0.1)
Basophils Relative: 1 % (ref 0–1)
Eosinophils Absolute: 0.1 10*3/uL (ref 0.0–0.7)
Eosinophils Relative: 5 % (ref 0–5)
HCT: 40 % (ref 39.0–52.0)
Hemoglobin: 13.3 g/dL (ref 13.0–17.0)
Lymphocytes Relative: 49 % — ABNORMAL HIGH (ref 12–46)
Lymphs Abs: 0.9 10*3/uL (ref 0.7–4.0)
MCH: 27.4 pg (ref 26.0–34.0)
MCHC: 33.3 g/dL (ref 30.0–36.0)
MCV: 82.5 fL (ref 78.0–100.0)
Monocytes Absolute: 0.2 10*3/uL (ref 0.1–1.0)
Monocytes Relative: 13 % — ABNORMAL HIGH (ref 3–12)
Neutro Abs: 0.6 10*3/uL — ABNORMAL LOW (ref 1.7–7.7)
Neutrophils Relative %: 32 % — ABNORMAL LOW (ref 43–77)
Platelets: 132 10*3/uL — ABNORMAL LOW (ref 150–400)
RBC: 4.85 MIL/uL (ref 4.22–5.81)
RDW: 12.5 % (ref 11.5–15.5)
WBC: 1.8 10*3/uL — ABNORMAL LOW (ref 4.0–10.5)

## 2012-07-15 NOTE — Progress Notes (Signed)
[facility] PROGRESS NOTE  Calvin Wolf ZOX:[id number] DOB: 08-11-1990 DOA: 07/11/2012 PCP: No primary provider on file.  Assessment/Plan:  1-Neutropenic fever: with enlarged lymph node.  -continue zosyn and doxycycline day 5. -Vancomycin stopped 7-9. -Ehrlichia pending , Cat scratch negative,  quantiferon/TB negative. . -RPR non reactive, malaria smear negative, EBV IgG more 750, toxo negative, RMSF less than 0.06, HIV RNA less than 20, CMV IgM less than 8 -Blood culture no growth to date.   -For lymph node Biopsy 7-10. -preliminary report from lymph node biopsy show Calvin Wolf 's diseases. Discussed care with Dr Calvin Wolf, if WBC stable tomorrow and if OK with ID will plan to discharge patient home 7-12. Patient will follow with Dr Calvin Wolf on 7-23.   2-Thrombocytopenia: most likely secondary to infection/ongoing process.  -will avoid heparin products and follow trend.   3-Soft BP: Continue to monitor. Lactic acid 0.8. cortisol level 4.2.  TSH. Continue with IV fluids.  4-DVT:SCD's   Code Status: Full Code.  Family Communication: Care discussed with patient.  Disposition Plan: Transfer to regular bed.    Consultants:  ID  Onc  Procedures:  none  Antibiotics: Zenaida Niece 7-7 Zosyn 7-7 Doxycycline 7-8   HPI/Subjective: Feeling well, wants to go home. No complaints.   Objective: Filed Vitals:   07/14/12 1900 07/14/12 2155 07/15/12 0610 07/15/12 0640  BP: 90/58 99/48 103/47 107/54  Pulse: 63 59 58 62  Temp: 98.1 F (36.7 C) 98.3 F (36.8 C) 98.2 F (36.8 C)   TempSrc: Oral Oral Oral   Resp: 18 20 20    Height: 5\' 7"  (1.702 m)     Weight: 57.4 kg (126 lb 8.7 oz)     SpO2: 100% 100% 100%     Intake/Output Summary (Last 24 hours) at 07/15/12 1254 Last data filed at 07/15/12 1015  Gross per 24 hour  Intake 1487.5 ml  Output     50 ml  Net 1437.5 ml   Filed Weights   07/11/12 2100 07/14/12 1900  Weight: 56.246 kg (124 lb) 57.4 kg (126 lb 8.7 oz)     Exam:   General:  No distress.   Cardiovascular: S 1, S 2 RRR  Respiratory: CTA  Abdomen: Bs present, soft, NT  Musculoskeletal: no edema.   Data Reviewed: Basic Metabolic Panel:  Recent Labs Lab 07/11/12 2011 07/13/12 0505 07/14/12 0320  NA 135 137 137  K 3.8 3.5 3.6  CL 99 100 101  CO2 29 29 28   GLUCOSE 81 99 86  BUN 10 7 6   CREATININE 0.82 0.76 0.75  CALCIUM 9.1 9.0 8.9   Liver Function Tests:  Recent Labs Lab 07/11/12 2011 07/13/12 0505  AST 24 21  ALT 16 15  ALKPHOS 70 67  BILITOT 0.3 0.4  PROT 7.2 7.3  ALBUMIN 3.7 3.6   No results found for this basename: LIPASE, AMYLASE,  in the last 168 hours No results found for this basename: AMMONIA,  in the last 168 hours CBC:  Recent Labs Lab 07/11/12 2010 07/13/12 0505 07/14/12 0320 07/15/12 0345  WBC 1.4* 1.7* 1.3* 1.8*  NEUTROABS 0.5* 0.5* 0.4* 0.6*  HGB 13.5 13.1 13.3 13.3  HCT 40.3 38.8* 39.4 40.0  MCV 83.6 84.0 83.8 82.5  PLT 124* 116* 116* 132*   Cardiac Enzymes: No results found for this basename: CKTOTAL, CKMB, CKMBINDEX, TROPONINI,  in the last 168 hours BNP (last 3 results) No results found for this basename: PROBNP,  in the last 8760 hours CBG: No results found for  this basename: GLUCAP,  in the last 168 hours  Recent Results (from the past 240 hour(s))  MRSA PCR SCREENING     Status: None   Collection Time    07/11/12  8:47 PM      Result Value Range Status   MRSA by PCR NEGATIVE  NEGATIVE Final   Comment:            The GeneXpert MRSA Assay (FDA     approved for NASAL specimens     only), is one component of a     comprehensive MRSA colonization     surveillance program. It is not     intended to diagnose MRSA     infection nor to guide or     monitor treatment for     MRSA infections.  CULTURE, BLOOD (ROUTINE X 2)     Status: None   Collection Time    07/11/12 10:50 PM      Result Value Range Status   Specimen Description BLOOD RIGHT ARM   Final   Special  Requests BOTTLES DRAWN AEROBIC AND ANAEROBIC 5CC   Final   Culture  Setup Time 07/12/2012 05:56   Final   Culture     Final   Value:        BLOOD CULTURE RECEIVED NO GROWTH TO DATE CULTURE WILL BE HELD FOR 5 DAYS BEFORE ISSUING A FINAL NEGATIVE REPORT   Report Status PENDING   Incomplete  CULTURE, BLOOD (ROUTINE X 2)     Status: None   Collection Time    07/11/12 10:55 PM      Result Value Range Status   Specimen Description BLOOD LEFT ARM   Final   Special Requests BOTTLES DRAWN AEROBIC AND ANAEROBIC 2CC   Final   Culture  Setup Time 07/12/2012 05:57   Final   Culture     Final   Value:        BLOOD CULTURE RECEIVED NO GROWTH TO DATE CULTURE WILL BE HELD FOR 5 DAYS BEFORE ISSUING A FINAL NEGATIVE REPORT   Report Status PENDING   Incomplete  MALARIA SMEAR     Status: None   Collection Time    07/12/12  9:15 AM      Result Value Range Status   Specimen Description BLOOD   Final   Special Requests Immunocompromised   Final   Malaria Prep     Final   Value: No Plasmodium or Other Blood Parasites Seen on Thick or Thin Smears For persons strongly suspected of having a blood parasite,but have negative smears, it is recommended that blood films be repeated approximately every 12 to 24 hours for 3 consecutive      days.   Report Status 07/13/2012 FINAL   Final  FUNGUS CULTURE W SMEAR     Status: None   Collection Time    07/14/12 11:30 AM      Result Value Range Status   Specimen Description LYMPH NODE   Final   Special Requests Immunocompromised   Final   Fungal Smear NO YEAST OR FUNGAL ELEMENTS SEEN   Final   Culture CULTURE IN PROGRESS FOR FOUR WEEKS   Final   Report Status PENDING   Incomplete  TISSUE CULTURE     Status: None   Collection Time    07/14/12 11:30 AM      Result Value Range Status   Specimen Description LYMPH NODE   Final   Special Requests NONE   Final  Gram Stain     Final   Value: RARE WBC PRESENT, PREDOMINANTLY MONONUCLEAR     NO ORGANISMS SEEN   Culture NO  GROWTH 1 DAY   Final   Report Status PENDING   Incomplete     Studies: US Biopsy  07/14/2012   *RADIOLOGY REPORT*  Clinical Data: Right cervical adenopathy  ULTRASOUND RIGHT CERVICAL ADENOPATHY 18 GAUGE CORE BIOPSY  Date:  07/14/2012 10:55:00  Radiologist:  Calvin Wolf, CalvinD.  Medications:  1% lidocaine locally  Guidance:  Ultrasound  Complications:  No immediate  PROCEDURE/FINDINGS:  Informed consent was obtained from the patient following explanation of the procedure, risks, benefits and alternatives. The patient understands, agrees and consents for the procedure. All questions were addressed.  A time out was performed.  Maximal barrier sterile technique utilized including caps, mask, sterile gowns, sterile gloves, large sterile drape, hand hygiene, and betadine  Previous imaging reviewed.  Preliminary ultrasound performed. Right cervical adenopathy demonstrated.  Overlying skin marked. Under sterile conditions and local anesthesia, an 18 gauge core needle was advanced into the right cervical adenopathy.  Several 1 cm 18 gauge core biopsies were obtained of the abnormal adenopathy under direct ultrasound.  These biopsies were placed in saline. Post procedure imaging demonstrates no evidence of hemorrhage or hematoma.  The patient tolerated the biopsy well.  IMPRESSION: Successful right cervical adenopathy 18 gauge core biopsies   Original Report Authenticated By: Judie Petit. Shick, CalvinD.    Scheduled Meds: . doxycycline (VIBRAMYCIN) IV  100 mg Intravenous Q12H  . feeding supplement  237 mL Oral BID BM  . piperacillin-tazobactam (ZOSYN)  IV  3.375 g Intravenous Q8H   Continuous Infusions: . sodium chloride 100 mL/hr (07/15/12 0144)    Principal Problem:   Neutropenic fever Active Problems:   Lump in neck   Immigrant with language difficulty    Time spent: 25 minutes.     Shelsea Hangartner  [facility] Pager [phone]. If 7PM-7AM, please contact night-coverage at [email or web address], password  [facility] 07/15/2012, 12:54 PM  LOS: 4 days

## 2012-07-15 NOTE — Progress Notes (Signed)
Calvin Wolf   DOB:1990/11/08   ZO#:[id number]   JWJ#:[id number]  Subjective: walking around in room; feels "OK." Minimal pain R neck mass. No trouble swallowing. No sweats or rash. No family or friends in room   Objective: woung middle [place] man examined in recliner Filed Vitals:   07/15/12 0640  BP: 107/54  Pulse: 62  Temp:   Resp:     Body mass index is 19.81 kg/(m^2).  Intake/Output Summary (Last 24 hours) at 07/15/12 0746 Last data filed at 07/15/12 0500  Gross per 24 hour  Intake   1540 ml  Output      0 ml  Net   1540 ml    Right neck mass still firm, palpable, and not obviously changed by my exam as compared to admission  CBG (last 3)  No results found for this basename: GLUCAP,  in the last 72 hours   Labs:  Lab Results  Component Value Date   WBC 1.8* 07/15/2012   HGB 13.3 07/15/2012   HCT 40.0 07/15/2012   MCV 82.5 07/15/2012   PLT 132* 07/15/2012   NEUTROABS 0.6* 07/15/2012    @LASTCHEMISTRY @  Urine Studies No results found for this basename: UACOL, UAPR, USPG, UPH, UTP, UGL, UKET, UBIL, UHGB, UNIT, UROB, ULEU, UEPI, UWBC, URBC, UBAC, CAST, CRYS, UCOM, BILUA,  in the last 72 hours  Basic Metabolic Panel:  Recent Labs Lab 07/11/12 2011 07/13/12 0505 07/14/12 0320  NA 135 137 137  K 3.8 3.5 3.6  CL 99 100 101  CO2 29 29 28   GLUCOSE 81 99 86  BUN 10 7 6   CREATININE 0.82 0.76 0.75  CALCIUM 9.1 9.0 8.9   GFR Estimated Creatinine Clearance: 119.6 ml/min (by C-G formula based on Cr of 0.75). Liver Function Tests:  Recent Labs Lab 07/11/12 2011 07/13/12 0505  AST 24 21  ALT 16 15  ALKPHOS 70 67  BILITOT 0.3 0.4  PROT 7.2 7.3  ALBUMIN 3.7 3.6   No results found for this basename: LIPASE, AMYLASE,  in the last 168 hours No results found for this basename: AMMONIA,  in the last 168 hours Coagulation profile No results found for this basename: INR, PROTIME,  in the last 168 hours  CBC:  Recent Labs Lab 07/11/12 2010 07/13/12 0505  07/14/12 0320 07/15/12 0345  WBC 1.4* 1.7* 1.3* 1.8*  NEUTROABS 0.5* 0.5* 0.4* 0.6*  HGB 13.5 13.1 13.3 13.3  HCT 40.3 38.8* 39.4 40.0  MCV 83.6 84.0 83.8 82.5  PLT 124* 116* 116* 132*   Cardiac Enzymes: No results found for this basename: CKTOTAL, CKMB, CKMBINDEX, TROPONINI,  in the last 168 hours BNP: No components found with this basename: POCBNP,  CBG: No results found for this basename: GLUCAP,  in the last 168 hours D-Dimer No results found for this basename: DDIMER,  in the last 72 hours Hgb A1c No results found for this basename: HGBA1C,  in the last 72 hours Lipid Profile No results found for this basename: CHOL, HDL, LDLCALC, TRIG, CHOLHDL, LDLDIRECT,  in the last 72 hours Thyroid function studies  Recent Labs  07/13/12 0845  TSH 2.843   Anemia work up No results found for this basename: VITAMINB12, FOLATE, FERRITIN, TIBC, IRON, RETICCTPCT,  in the last 72 hours Microbiology Recent Results (from the past 240 hour(s))  MRSA PCR SCREENING     Status: None   Collection Time    07/11/12  8:47 PM      Result Value Range Status  MRSA by PCR NEGATIVE  NEGATIVE Final   Comment:            The GeneXpert MRSA Assay (FDA     approved for NASAL specimens     only), is one component of a     comprehensive MRSA colonization     surveillance program. It is not     intended to diagnose MRSA     infection nor to guide or     monitor treatment for     MRSA infections.  CULTURE, BLOOD (ROUTINE X 2)     Status: None   Collection Time    07/11/12 10:50 PM      Result Value Range Status   Specimen Description BLOOD RIGHT ARM   Final   Special Requests BOTTLES DRAWN AEROBIC AND ANAEROBIC 5CC   Final   Culture  Setup Time 07/12/2012 05:56   Final   Culture     Final   Value:        BLOOD CULTURE RECEIVED NO GROWTH TO DATE CULTURE WILL BE HELD FOR 5 DAYS BEFORE ISSUING A FINAL NEGATIVE REPORT   Report Status PENDING   Incomplete  CULTURE, BLOOD (ROUTINE X 2)     Status:  None   Collection Time    07/11/12 10:55 PM      Result Value Range Status   Specimen Description BLOOD LEFT ARM   Final   Special Requests BOTTLES DRAWN AEROBIC AND ANAEROBIC 2CC   Final   Culture  Setup Time 07/12/2012 05:57   Final   Culture     Final   Value:        BLOOD CULTURE RECEIVED NO GROWTH TO DATE CULTURE WILL BE HELD FOR 5 DAYS BEFORE ISSUING A FINAL NEGATIVE REPORT   Report Status PENDING   Incomplete  MALARIA SMEAR     Status: None   Collection Time    07/12/12  9:15 AM      Result Value Range Status   Specimen Description BLOOD   Final   Special Requests Immunocompromised   Final   Malaria Prep     Final   Value: No Plasmodium or Other Blood Parasites Seen on Thick or Thin Smears For persons strongly suspected of having a blood parasite,but have negative smears, it is recommended that blood films be repeated approximately every 12 to 24 hours for 3 consecutive      days.   Report Status 07/13/2012 FINAL   Final      Studies:  US Biopsy  07/14/2012   *RADIOLOGY REPORT*  Clinical Data: Right cervical adenopathy  ULTRASOUND RIGHT CERVICAL ADENOPATHY 18 GAUGE CORE BIOPSY  Date:  07/14/2012 10:55:00  Radiologist:  M. Ruel Favors, M.D.  Medications:  1% lidocaine locally  Guidance:  Ultrasound  Complications:  No immediate  PROCEDURE/FINDINGS:  Informed consent was obtained from the patient following explanation of the procedure, risks, benefits and alternatives. The patient understands, agrees and consents for the procedure. All questions were addressed.  A time out was performed.  Maximal barrier sterile technique utilized including caps, mask, sterile gowns, sterile gloves, large sterile drape, hand hygiene, and betadine  Previous imaging reviewed.  Preliminary ultrasound performed. Right cervical adenopathy demonstrated.  Overlying skin marked. Under sterile conditions and local anesthesia, an 18 gauge core needle was advanced into the right cervical adenopathy.  Several 1 cm  18 gauge core biopsies were obtained of the abnormal adenopathy under direct ultrasound.  These biopsies were placed in saline.  Post procedure imaging demonstrates no evidence of hemorrhage or hematoma.  The patient tolerated the biopsy well.  IMPRESSION: Successful right cervical adenopathy 18 gauge core biopsies   Original Report Authenticated By: Judie Petit. Miles Costain, M.D.    Assessment: 22 y.o. with fever, neutropenia (ANC 500), thrombocytopenia, Right neck adenopathy  (1) ID workup in progress  (2) for FNA of neck mass 07/14/2012 -- results pending  (3) afebrile but with persistent neutropenia and thrombocytopenia   Plan: He would like to go home; however he is still neutropenic. ANC and platelets slightly better today. I do not palpate any change in the neck mass. If we do not get a definitive diagnosis from the workup so far her will need an excisional biopsy of the Right neck mass.   The question is whether ID feels we need to wait on results from tests already sent or whether we should plan to do it Monday. If we are considering excisional biopsy Monday we should consult surgery today. -- Will discuss with Dr Ninetta Lights.  Will follow with you   Lowella Dell, MD 07/15/2012  7:46 AM

## 2012-07-15 NOTE — Progress Notes (Signed)
INFECTIOUS DISEASE PROGRESS NOTE  ID: Calvin Wolf is a 22 y.o. male with  Principal Problem:   Neutropenic fever Active Problems:   Lump in neck   Immigrant with language difficulty  Subjective: Without complaints  Abtx:  Anti-infectives   Start     Dose/Rate Route Frequency Ordered Stop   07/13/12 1200  vancomycin (VANCOCIN) IVPB 1000 mg/200 mL premix  Status:  Discontinued     1,000 mg 200 mL/hr over 60 Minutes Intravenous Every 8 hours 07/13/12 0645 07/13/12 1512   07/12/12 1000  doxycycline (VIBRAMYCIN) 100 mg in dextrose 5 % 250 mL IVPB     100 mg 125 mL/hr over 120 Minutes Intravenous Every 12 hours 07/12/12 0916     07/11/12 2200  vancomycin (VANCOCIN) IVPB 750 mg/150 ml premix  Status:  Discontinued     750 mg 150 mL/hr over 60 Minutes Intravenous Every 8 hours 07/11/12 2112 07/13/12 0645   07/11/12 2200  piperacillin-tazobactam (ZOSYN) IVPB 3.375 g     3.375 g 12.5 mL/hr over 240 Minutes Intravenous Every 8 hours 07/11/12 2112        Medications:  Scheduled: . doxycycline (VIBRAMYCIN) IV  100 mg Intravenous Q12H  . feeding supplement  237 mL Oral BID BM  . piperacillin-tazobactam (ZOSYN)  IV  3.375 g Intravenous Q8H    Objective: Vital signs in last 24 hours: Temp:  [98.1 F (36.7 C)-98.3 F (36.8 C)] 98.2 F (36.8 C) (07/11 0610) Pulse Rate:  [58-63] 62 (07/11 0640) Resp:  [18-20] 20 (07/11 0610) BP: (90-107)/(47-58) 107/54 mmHg (07/11 0640) SpO2:  [100 %] 100 % (07/11 0610) Weight:  [57.4 kg (126 lb 8.7 oz)] 57.4 kg (126 lb 8.7 oz) (07/10 1900)   General appearance: alert, cooperative and no distress Neck: wound dressed with bandaid Resp: clear to auscultation bilaterally Cardio: regular rate and rhythm GI: normal findings: bowel sounds normal and soft, non-tender  Lab Results  Recent Labs  07/13/12 0505 07/14/12 0320 07/15/12 0345  WBC 1.7* 1.3* 1.8*  HGB 13.1 13.3 13.3  HCT 38.8* 39.4 40.0  NA 137 137  --   K 3.5 3.6  --   CL  100 101  --   CO2 29 28  --   BUN 7 6  --   CREATININE 0.76 0.75  --    Liver Panel  Recent Labs  07/13/12 0505  PROT 7.3  ALBUMIN 3.6  AST 21  ALT 15  ALKPHOS 67  BILITOT 0.4   Sedimentation Rate No results found for this basename: ESRSEDRATE,  in the last 72 hours C-Reactive Protein No results found for this basename: CRP,  in the last 72 hours  Microbiology: Recent Results (from the past 240 hour(s))  MRSA PCR SCREENING     Status: None   Collection Time    07/11/12  8:47 PM      Result Value Range Status   MRSA by PCR NEGATIVE  NEGATIVE Final   Comment:            The GeneXpert MRSA Assay (FDA     approved for NASAL specimens     only), is one component of a     comprehensive MRSA colonization     surveillance program. It is not     intended to diagnose MRSA     infection nor to guide or     monitor treatment for     MRSA infections.  CULTURE, BLOOD (ROUTINE X 2)  Status: None   Collection Time    07/11/12 10:50 PM      Result Value Range Status   Specimen Description BLOOD RIGHT ARM   Final   Special Requests BOTTLES DRAWN AEROBIC AND ANAEROBIC 5CC   Final   Culture  Setup Time 07/12/2012 05:56   Final   Culture     Final   Value:        BLOOD CULTURE RECEIVED NO GROWTH TO DATE CULTURE WILL BE HELD FOR 5 DAYS BEFORE ISSUING A FINAL NEGATIVE REPORT   Report Status PENDING   Incomplete  CULTURE, BLOOD (ROUTINE X 2)     Status: None   Collection Time    07/11/12 10:55 PM      Result Value Range Status   Specimen Description BLOOD LEFT ARM   Final   Special Requests BOTTLES DRAWN AEROBIC AND ANAEROBIC 2CC   Final   Culture  Setup Time 07/12/2012 05:57   Final   Culture     Final   Value:        BLOOD CULTURE RECEIVED NO GROWTH TO DATE CULTURE WILL BE HELD FOR 5 DAYS BEFORE ISSUING A FINAL NEGATIVE REPORT   Report Status PENDING   Incomplete  MALARIA SMEAR     Status: None   Collection Time    07/12/12  9:15 AM      Result Value Range Status    Specimen Description BLOOD   Final   Special Requests Immunocompromised   Final   Malaria Prep     Final   Value: No Plasmodium or Other Blood Parasites Seen on Thick or Thin Smears For persons strongly suspected of having a blood parasite,but have negative smears, it is recommended that blood films be repeated approximately every 12 to 24 hours for 3 consecutive      days.   Report Status 07/13/2012 FINAL   Final  FUNGUS CULTURE W SMEAR     Status: None   Collection Time    07/14/12 11:30 AM      Result Value Range Status   Specimen Description LYMPH NODE   Final   Special Requests Immunocompromised   Final   Fungal Smear NO YEAST OR FUNGAL ELEMENTS SEEN   Final   Culture CULTURE IN PROGRESS FOR FOUR WEEKS   Final   Report Status PENDING   Incomplete  TISSUE CULTURE     Status: None   Collection Time    07/14/12 11:30 AM      Result Value Range Status   Specimen Description LYMPH NODE   Final   Special Requests NONE   Final   Gram Stain     Final   Value: RARE WBC PRESENT, PREDOMINANTLY MONONUCLEAR     NO ORGANISMS SEEN   Culture NO GROWTH 1 DAY   Final   Report Status PENDING   Incomplete    Studies/Results: US Biopsy  07/14/2012   *RADIOLOGY REPORT*  Clinical Data: Right cervical adenopathy  ULTRASOUND RIGHT CERVICAL ADENOPATHY 18 GAUGE CORE BIOPSY  Date:  07/14/2012 10:55:00  Radiologist:  Judie Petit. Ruel Favors, M.D.  Medications:  1% lidocaine locally  Guidance:  Ultrasound  Complications:  No immediate  PROCEDURE/FINDINGS:  Informed consent was obtained from the patient following explanation of the procedure, risks, benefits and alternatives. The patient understands, agrees and consents for the procedure. All questions were addressed.  A time out was performed.  Maximal barrier sterile technique utilized including caps, mask, sterile gowns, sterile gloves, large sterile  drape, hand hygiene, and betadine  Previous imaging reviewed.  Preliminary ultrasound performed. Right cervical  adenopathy demonstrated.  Overlying skin marked. Under sterile conditions and local anesthesia, an 18 gauge core needle was advanced into the right cervical adenopathy.  Several 1 cm 18 gauge core biopsies were obtained of the abnormal adenopathy under direct ultrasound.  These biopsies were placed in saline. Post procedure imaging demonstrates no evidence of hemorrhage or hematoma.  The patient tolerated the biopsy well.  IMPRESSION: Successful right cervical adenopathy 18 gauge core biopsies   Original Report Authenticated By: Judie Petit. Miles Costain, M.D.     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  No need for further anbx, will stop doxy  Thank you so much for the opportunity to be involved in this fascinating case.           Johny Sax Infectious Diseases (pager) [phone] [email or web address] 07/15/2012, 2:25 PM   LOS: 4 days

## 2012-07-15 NOTE — Progress Notes (Signed)
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 I am copying the information on this entity from [facility] for the treatment team's reference.  Literature review current through: Jun 2014.  This topic last updated: Feb 11, 2012.  INTRODUCTION - Kikuchi disease, also called Kikuchi-Fujimoto disease or Kikuchi histiocytic necrotizing lymphadenitis, was originally described in young women and is a rare, benign condition of unknown cause usually characterized by cervical lymphadenopathy and fever. Histopathology of the involved lymph nodes differentiates Kikuchi disease from several more serious conditions that it may mimic. PATHOGENESIS - While the pathogenesis of Kikuchi disease is unknown, the clinical presentation, course, and histologic changes suggest an immune response of T cells and histiocytes to an infectious agent. Numerous inciting agents have been proposed, including Epstein Barr virus (EBV) [1,2], human herpesvirus 6, human herpesvirus 8 [3], human immunodeficiency virus (HIV), parvovirus B19 [4], paramyxoviruses, parainfluenza virus, Yersinia enterocolitica, and Toxoplasma. In one report, EBV was detected by in situ hybridization in tissue in all of 10 patients examined [5,6], but immunohistochemistry detected EBV-encoded protein in only one of these patients. An observation that is consistent with a viral etiology is increased levels of interferon-alpha and of other proteins stimulated by interferon-alpha including 2',5'-oligoadenylate synthetase and tubuloreticular structures in the cytoplasm of stimulated lymphocytes, histiocytes, and vascular endothelium [7]. Apoptotic cell death mediated by cytotoxic CD8 positive T lymphocytes is the principal mechanism of cellular destruction [8-10]. Histiocytes may act as enhancers. The apoptosis appears to be induced by the Fas-Fas ligand system. Morphologic characteristics of apoptotic cells including nuclear chromatin  condensation and fragmentation along the nuclear membrane with intact organelles, and histiocytes phagocytosing karyorrhectic debris (apoptotic bodies) are found on transmission electron microscopy. A possible role for interferon-gamma and interleukin-6 (IL-6) in the pathogenesis of this syndrome is suggested by one study of four men with biopsy-proven Kikuchi disease [11]. During the acute phase of illness, these patients had elevated serum levels of interferon gamma and IL-6 but not interferon alpha, tumor necrosis factor, or IL-2. The interferon-gamma and IL-6 levels returned to normal during convalescence. Subsequent reports support this role [12]. Analysis of lymph nodes biopsies in patients with Kikuchi disease show T-bet-expressing CD4 cells accompanied by T-bet-positive CD8 and B cells [13]. Kikuchi syndrome shares sex and age predisposition as well as histologic features with systemic lupus erythematosus (SLE). Tubuloreticular structures in the lymphocytes and endothelial cells in patients with SLE have been observed to be similar to those seen in Kikuchi disease. One ultrastructural study proposed that Kikuchi syndrome reflects a self-limited, SLE-like autoimmune condition caused by virus-infected transformed lymphocytes [14]. (See "Epidemiology and pathogenesis of systemic lupus erythematosus".) EPIDEMIOLOGY - While initially described in young women, Kikuchi disease clearly also occurs in men. The ratio of affected males to females in three series was 1:4, 1:1.6, and 1:1.26, respectively [15-17]. In a [place] report of 20 individuals younger than 20 years of age with [name] disease, the gender distribution was equal [18]. Most patients are younger than 20 years of age, but this condition has been reported in patients ranging in age from 61 to 68 years, most of whom were previously well [19-22]. The mean age at presentation in a [place] series was 30 years [15]. Ethnic distribution - Although  initially described in two separate cases from [place], [name] disease has since been found in all racial and ethnic groups and in many countries, including the [place] [15,23]. In a study of 89 patients with Kikuchi disease in the [place], 75 percent were  Caucasian [15]. The frequency of this condition varies widely in different groups, but it has been most frequently reported from [place]. In a series of 920 lymph node biopsies from [place], only five (0.5 percent) had changes characteristic of Kikuchi disease [24] compared to 5.7 percent in a report from [place] [25]. In a [place] study of 147 patients presenting to an outpatient clinic, Kikuchi disease (34.7 percent) and tuberculous adenitis (22.4 percent) were the most common causes of cervical adenitis [26]. CLINICAL FEATURES - The most common clinical presentation of Kikuchi disease is fever and cervical lymphadenopathy in a previously well young woman. Fever is a primary symptom in 30 to 50 percent of patients. It is typically low grade and persists for about one week [15], though rarely for up to one month. In a retrospective literature review that described 244 patients with Kikuchi disease, the most common symptoms were fever (35 percent), fatigue (7 percent), and joint pain (7 percent) [27]. The most common clinical and laboratory findings were lymphadenopathy (100 percent), rash (10 percent), arthritis (7 percent), hepatosplenomegaly (3 percent), leukopenia (43 percent), elevated sedimentation rate (40 percent), and anemia (23 percent). Systemic symptoms may accompany fever and lymphadenopathy and appear to be more prominent in patients with extranodal involvement [28]. Systemic symptoms include: ?Night sweats ?Nausea ?Vomiting ?Weight loss (in approximately 10 percent) ?Diarrhea A variety of other symptoms and physical findings occur sporadically in patients with Kikuchi disease. These include rigors, myalgia, arthralgia, chest and  abdominal pain, splenomegaly, and hepatomegaly, which may be associated with abdominal lymphadenopathy [15]. The clinical presentation in pediatric patients appears to be similar to that in adults [29]. In one study, fever and rash were more common and generalized lymphadenopathy was less common in children compared with adults [30]. The risk of evolution into an autoimmune syndrome may be higher than in adult patients [31]. Occasional reports describe Kikuchi disease associated with other conditions, such as Still's disease [32], cryptogenic organizing pneumonia [33], and following B cell lymphoma [34]. Rash - Transient skin rashes similar to rubella or drug-induced eruptions may be seen in sicker patients [35]. The presence of a malar "butterfly rash" should raise the diagnosis of systemic lupus erythematosus (SLE) that, as noted above, has been associated with Kikuchi disease. Some reports describe skin manifestations in up to 40 percent of patients, including facial erythema; erythematous macules, patches, papules, or plaques; scattered indurated lesions; ulcers; polymorphous light eruptions; leukocytoclastic vasculitis; scale; pruritus; alopecia; conjunctival injection; and oral ulceration [1,36,37]. Lymph node involvement - Lymph node involvement is usually cervical and localized in Kikuchi disease. As an example, all 52 patients in a [place] report had cervical node involvement [38]. In the [place] series of 108 patients, 83 had lymphadenopathy localized to one site, usually cervical and particularly posterior cervical; only three patients had bilateral cervical adenopathy [15]. There may be more extensive node involvement, or involvement of other sites. These sites include the axillary, epitrochlear, mediastinal, inguinal, intraparotid, iliac, retrocrural, celiac, and peripancreatic nodes. The nodes are usually only moderately enlarged (1 to 2 cm in diameter) but occasionally are much larger (?7  cm) [38]. They are typically firm, smooth, discrete, and mobile. The nodal enlargement is often associated with dull or acute pain. Rarely, nodal enlargement may be minimal or only mediastinal, or retroperitoneal nodes may be involved [39,40]. The diagnosis is unlikely to be considered prior to biopsy in such patients who may present as a fever of unknown origin. Other manifestations - Aseptic meningitis [41,42], acute cerebellar symptoms with  tremor and ataxia [43], thyroiditis and parotid enlargement [44], polymyositis [45], hemophagocytosis [46-48], panuveitis [49], bilateral papillary conjunctivitis [50], autoimmune hepatitis [51], brachial neuritis [52], and peripheral neuropathy [53] have all been reported. In addition, there are reports of antiphospholipid syndrome with multiorgan failure [54]. LABORATORY STUDIES - The majority of patients with Kikuchi disease have a normal complete blood count [15], although leukopenia is seen in 20 to 32 percent [16,26]. Atypical lymphocytes have been reported in up to 25 percent of patients [38]. Other less common findings include thrombocytopenia, pancytopenia, and, in those with severe disease, anemia of chronic disease [1,55]. The erythrocyte sedimentation rate can be normal but was elevated to more than 60 mm/h in 70 percent of patients in one series [39]. Other nonspecific findings can include mildly abnormal liver function tests and elevated serum lactate dehydrogenase [40]. Bone marrow examination - An increase in macrophages without atypical cells is the most frequent bone marrow finding [16]. In two patients, increased numbers of mature hemophagocytic histiocytes in the bone marrow led to the misdiagnosis of virus-associated hemophagocytic syndrome [38]. Serologic studies - Antinuclear antibodies (ANA), rheumatoid factor, and lupus erythematosus preparations are generally negative. Some patients initially diagnosed with Kikuchi disease have subsequently  developed systemic lupus erythematosus (SLE) [15,56], and an ANA test should be performed in patients with suspected Kikuchi syndrome who have features suggestive of SLE in order to exclude this diagnosis. One report describes a transient rise in anti-DNA and antiribonuclear protein antibody levels [16]. Serology for Epstein Barr virus (EBV), cytomegalovirus, human immunodeficiency virus (HIV), toxoplasmosis, Yersinia enterocolitica, cat scratch disease, and other infectious agents is often performed since these infections are considered in the differential diagnosis of fever and lymphadenopathy. DIAGNOSIS - The diagnosis of Kikuchi disease is made by lymph node biopsy. Biopsy should be performed, despite the self-limited nature of this syndrome, in order to exclude more serious conditions requiring aggressive therapy such as lymphoma. Patients with Kikuchi disease have been misdiagnosed as having lymphoma and treated with cytotoxic agents when physicians and pathologists are unfamiliar with this entity [15]. Other conditions that have been confused with Kikuchi disease are tuberculous adenitis, lymphogranuloma venereum [57], and Kawasaki disease [58,59]. Fine needle aspiration of a lymph node has been performed for diagnosis with attempts to preserve the architectural relationship of different cell types by preparation of cell block sections from tissue fragments [60]. Although excisional biopsy is most often recommended, fine needle aspiration is increasingly useful in the hands of experienced cytopathologists using smears and cell block preparations [61,62]. Pathology Lymph nodes - The histology of the lymph node in Kikuchi disease can usually be easily differentiated from most known infectious conditions in the differential diagnosis of fever and lymphadenopathy but not from that seen in some patients with systemic lupus erythematosus (SLE) [15,19,37]. Yellowish necrotic foci may rarely be noted on the cut  surface of the node. Microscopic examination usually shows paracortical foci often with necrosis and a histiocytic cellular infiltrate (picture 1 and picture 2). These foci may be single or multiple. The capsule may be infiltrated, and perinodal inflammation is common. The necrotizing process is often confined to circumscribed areas of eosinophilic fibrinoid material with irregular distribution of fragments of nuclear debris. Overt coagulative necrosis is not a prerequisite for the diagnosis of Kikuchi disease [19]. The histological appearance changes as the disease progresses. Early biopsies in the "proliferative phase" show follicular hyperplasia and paracortical expansion by lymphocytes, T and B cell blasts, plasmacytoid monocytes and histiocytes with numerous apoptoses in the background. In  the "proliferative phase," the presence of numerous blast cells raises the differential diagnoses of lymphoma, Epstein Barr virus (EBV) infection, and Herpes simplex infection. Preservation of the nodal architecture, the polyclonal infiltrate, and negative viral immunohistochemistry exclude these conditions. Later biopsies in the "necrotizing phase" show necrosis without a neutrophilic infiltrate associated with progressive dominance of histiocytes as the major cell type. The histiocytes often have crescentic nuclei and contain phagocytosed debris. Immunohistochemical stains show CD68 positive plasmacytoid monocytes and histiocytes with predominantly CD8 positive T lymphocytes [63]. The absence of neutrophils in the "necrotizing phase" is helpful in distinguishing this condition from SLE and drug induced lymphadenopathy. Xanthomatous appearance seems to be a distinct histologic variant [64]. The histologic differential diagnosis includes SLE, herpes simplex lymphadenitis, and lymphoma (non-Hodgkin and Hodgkin). In SLE, hematoxylin bodies and plasma cells are also seen. In herpes simplex lymphadenitis, there are fewer  surrounding mononuclear cells, and neutrophils are usually present. Necrosis associated with Hodgkin lymphoma usually includes neutrophils and, in contrast to Kikuchi disease, the large atypical cells (Reed-Sternberg cell variants) are CD15, CD30, and CD45 positive. It has been suggested that plasmacytoid dendritic cells more frequently infiltrate the lymph nodes in Kikuchi disease than in either reactive lymphadenitis or T or B cell lymphoma, irrespective of the age of the [name] lesion, and thus they may be useful indicators for the cytologic diagnosis of Kikuchi disease [65]. Skin - In contrast to the characteristic histopathologic changes seen in lymph nodes of patients with Kikuchi disease, the histologic features in skin biopsy samples are highly variable and nonspecific. Common histologic findings that may be present include epidermal change (most commonly with necrotic keratinocytes), non-neutrophilic karyorrhectic debris, basal vacuolar change, papillary dermal edema, and a lymphocytic infiltrate [37]. In a review of skin biopsies from 16 cases, features noted included vacuolar interface changes (in 75 percent), necrotic keratinocytes (in 68 percent), karyorrhexis (in 100 percent), superficial and deep lymphohistiocytic infiltration (in 100 and 56 percent, respectively), and panniculitis (in 60 percent) [66]. Infiltrating cells were predominantly CD68 and CD163 positive histiocytes and CD3 positive T lymphocytes. IMAGING STUDIES - Computed tomography (CT) imaging of the affected lymph nodes typically show perinodal infiltration (81 percent), and homogenous nodal contrast enhancement (83 percent) [67]. On ultrasound, lymph nodes may appear suspicious for malignancy; 66 percent of a series of 29 lymph nodes evaluated sonographically showed features suggestive of malignancy [68]. TREATMENT - No effective treatment has been established for Kikuchi disease. Signs and symptoms usually resolve within one to  four months. Patients with severe or persisting symptoms have been treated with glucocorticoids [69] or high-dose glucocorticoids with intravenous immunoglobulin [70] with apparent benefit. There has been one report of recurrent Kikuchi disease successfully treated with hydroxychloroquine [71]. Affected patients should be followed for some years because they can develop systemic lupus erythematosus (SLE), and recurrences of Kikuchi disease can occasionally continue for many years. One patient, for example, had four episodes of lymphadenopathy over 18 years and another had two episodes separated by six years [55,59]. OUTCOMES - Kikuchi disease is self-limited in the majority of patients, although recurrences have been reported. In a series of 108 patients, 59 of 64 patients (92 percent) for whom data were available were alive and well at a median of 32 months of follow-up [15]. Among 102 patients in [place] between 2001 and 2006, three developed systemic lupus erythematosus (SLE), eight experienced early relapse, and 13 had late recurrence [72]. Recurrent cases were symptomatic for longer than initial cases. A positive fluorescent antinuclear antibody test was associated  with a higher risk of recurrent disease. SUMMARY AND RECOMMENDATIONS ?Kikuchi disease is a rare, benign condition of unknown cause usually characterized by cervical lymphadenopathy and fever. Histopathology of the involved lymph nodes differentiates Kikuchi disease from several more serious conditions that it may mimic. (See 'Introduction' above.) ?While the pathogenesis of Kikuchi disease is unknown, the clinical presentation, course, and histologic changes suggest an immune response of T cells and histiocytes to an infectious agent. (See 'Pathogenesis' above.) ?Although initially described in young women, [name] disease also occurs in men. Most patients are younger than 20 years of age and were previously well. (See 'Epidemiology'  above.) ?Kikuchi disease has been most frequently reported from [place] but has been found in all racial and ethnic groups and in many countries. (See 'Ethnic distribution' above.) ?The most common clinical presentation is low-grade fever and cervical lymphadenopathy in a previously well young woman. Patients may also have fatigue, joint pain, rash, arthritis, hepatosplenomegaly, night sweats, nausea, vomiting, weight loss, and/or diarrhea. (See 'Clinical features' above.) ?Lymph node involvement is usually cervical and localized. (See 'Lymph node involvement' above.) ?The diagnosis of [name] disease is made by lymph node biopsy. Microscopic examination usually shows paracortical foci often with necrosis and a histiocytic cellular infiltrate (picture 1 and picture 2). (See 'Diagnosis' above.)  ?Antinuclear antibodies should be performed in patients with suspected Kikuchi disease who have features suggestive of systemic lupus erythematosus (SLE) in order to exclude SLE. (See 'Serologic studies' above.) ?No effective treatment has been established for Kikuchi disease. Signs and symptoms usually resolve within one to four months. (See 'Treatment' above.) Use of UpToDate is subject to the Subscription and License Agreement.  REFERENCES 1. Yen A, Fearneyhough P, Raimer SS, Hudnall SD. 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 J Am Acad Dermatol [phone]; 36:342. 2. Hudnall SD, Whitney Post, Amr S, et al. Detection of human herpesvirus DNA in Kikuchi-Fujimoto disease and reactive lymphoid hyperplasia. Int J Clin Exp Pathol 2008; 1:362. 3. Huh Warren Danes [facility], Burnard Leigh, et al. Kaposi's sarcoma-associated herpesvirus in Kikuchi's disease. Hum Pathol 1478; 29:1091. 4. Yufu Y, Matsumoto M, Miyamura T, et al. 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 Br J Haematol 1997;  [place]row. 5. Chiu CF, Chow Alexis Frock, et al. Virus infection in patients with histiocytic necrotizing lymphadenitis in [place]. Detection of Epstein-Barr virus, type I human T-cell lymphotropic virus, and parvovirus B19. Am J Clin Pathol 2000; 113:774. 6. Hudnall SD. Kikuchi-Fujimoto disease. Is Epstein-Barr virus the culprit? Am J Clin Pathol 2000; 113:761. 7. Rich SA. De novo synthesis and secretion of a 36-kD protein by cells that form lupus inclusions in response to alpha-interferon. J Clin Invest 1995; 95:219. 8. Iguchi H, Sunami K, Yamane H, et al. Apoptotic cell death in Kikuchi's disease: a TEM study. Acta Otolaryngol Suppl 1998; 538:250. 9. Hassel Neth, Shimazaki K, Kume T, et al. Perforin and Fas pathways of cytotoxic T-cells in histiocytic necrotizing lymphadenitis. Histopathology 1998; 33:471. 10. Merrily Brittle, Torii H, et al. Histiocytic necrotizing lymphadenitis (Kikuchi's disease): the necrotic appearance of the lymph node cells is caused by apoptosis. J Dermatol 1999; 26:385. 11. Sherwood Gambler, Yvonna Alanis K, et al. Elevated serum interferon gamma and interleukin-6 in patients with necrotizing lymphadenitis (Kikuchi's disease). Br J Haematol 1996; 95:613. 12. Yabe H, Sinzato I, Hashimoto K. [Necrotizing lymphadenitis presenting as mesenteric lymphadenopathy]. Rinsho Ketsueki 1999; [id number]. 13. Jhrens K, Anagnostopoulos I, Drkop Ayesha Mohair. Different T-bet expression patterns characterize particular reactive lymphoid  tissue lesions. Histopathology 2006; 48:343. 14. Francoise Ceo, Ruthe Mannan, et al. An ultrastructural study of subacute necrotizing lymphadenitis. Am J Pathol [phone]; [id number]. 15. Dorfman RF, Mirian Mo. Kikuchi's histiocytic necrotizing lymphadenitis: an analysis of 108 cases with emphasis on differential diagnosis. Semin Diagn Pathol 9604; 5:329. 16. Erling Cruz, Akaike Y, Jinnouchi H, et al. Necrotizing lymphadenitis: a review of clinicopathological, immunohistochemical  and ultrastructural studies. Hematol Oncol 1990; 8:251. 17. Lin HC, Su CY, Huang CC, et al. Kikuchi's disease: a review and analysis of 61 cases. Otolaryngol Head Neck Surg 2003; 128:650. 18. Seo JH, Shim HS, Park JJ, et al. A clinical study of histiocytic necrotizing lymphadenitis (Kikuchi's disease) in children. Int J Pediatr Otorhinolaryngol 2008; 54:0981. 19. Tsang WY, Estill Dooms, Ng CS. Kikuchi's lymphadenitis. A morphologic analysis of 75 cases with special reference to unusual features. Am J Surg Pathol 1994; 18:219. 20. Catarina Hartshorn, Tamala Fothergill [facility]. Kikuchi-Fujimoto disease with prolonged fever in children. Pediatrics 2004; 114:e752. 21Clarita Leber, Corene Cornea MP. Kikuchi-Fujimoto disease: a rare but important cause of lymphadenopathy. Acta Paediatr 2003; 92:261. 22. Ray A, Muse VV, [place]. Case records of the [facility]. Case [phone]. A 22 year old man with fever and lymphadenopathy. Malva Limes Med 2013; [id number]. 23. Turner RR, Shelton Silvas, Dorfman RF. Necrotizing lymphadenitis. A study of 30 cases. Am J Surg Pathol [phone]; 7:115. 24. Kutty MK, Anim JT, Sowayan S. Histiocytic necrotising lymphadenitis (Kikuchi-Fujimoto disease) in [place]. Trop Geogr Med [phone]; 43:68. 25. Ophelia Charter. Surgical pathology of lymph node biopsy specimens in [place] with an update on adult T cell leukaemia/ lymphoma. In: Lymphoid Malignancy: Immunocytology and Cytogenetics, Dorice Lamas, Kadin ME, Mikatu A (Mack Guise), [place] and [place], [place]. p.109. 26. Rosemarie Beath, Cheong HJ, [place], et al. Disease spectrum of cervical lymphadenitis: analysis based on ultrasound-guided core-needle gun biopsy. J Infect 2007; 55:310. 27. Kucukardali Y, Solmazgul E, Kunter E, et al. Kikuchi-Fujimoto Disease: analysis of 244 cases. Clin Rheumatol 2007; 26:50. 28. Kuo TT. Cutaneous manifestation of Kikuchi's histiocytic necrotizing lymphadenitis. Am J Surg Pathol 1990; 14:872. 29. Cherlynn Polo, Mertha Finders [facility], et al.  Clinical and laboratory manifestations of Kikuchi's disease in children and differences between patients with and without prolonged fever. Pediatr Infect Dis J 2005; 24:551. 30. Kim TY, Ha KS, Danise Mina, et al. Characteristics of Kikuchi-Fujimoto disease in children compared with adults. Eur J Pediatr 2014; 173:111. 31. Wang TJ, Olean Ree, Lin YT, Burbank BL. Kikuchi-Fujimoto disease in children: clinical features and disease course. J Microbiol Immunol Infect 2004; 37:219. 32. Cousin F, Grzard Thornton Papas, et al. [name] disease associated with Still disease. Int J Dermatol 1999; 38:464. 33Leigh Aurora, Leanne Chang disease associated with cryptogenic organizing pneumonia: case report and literature review. [facility] Infect Dis 2010; 10:64. 34. Yoshino T, Mannami T, Ichimura K, et al. 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 Hum Pathol 2000; [id number]. 35Gaylord Shih, Takeshita, M, Okamura, S, et al. Histiocytic necrotizing lymphadenitis. Path Clin Med [phone]; 1:1541. 36. Tommye Standard, Matsumura T, Sato-Matsumura KC, et al. Kikuchi's disease and the skin: case report and review of the literature. Br J Dermatol 2001; 144:885. 37Wendi Maya AR, Bernerd Limbo, Aughenbaugh WD. Kikuchi's disease: case report and systematic review of cutaneous and histopathologic presentations. J Am Acad Dermatol 2008; 59:130. 38. Kuo TT. Kikuchi's disease (histiocytic necrotizing lymphadenitis). A clinicopathologic study of 79 cases with an analysis of histologic subtypes, immunohistology,  and DNA ploidy. Am J Surg Pathol 1995; 19:798. [address] AH, Krasinskas AM, Imogene Burn, Gluckman SJ. Kikuchi-Fujimoto disease: a benign cause of fever and lymphadenopathy. Am J Med [phone]; 101:401. 40. Sharia Reeve, Phill Myron, Dotsero BA. Kikuchi's disease with liver dysfunction presenting as fever of unknown origin. Lancet 1989; 2:986. 41. Karenann Cai, Oizumi K. 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 J Neurol Sci 1999; 163:187. 42. Mathew LG, Cherian T, Srivastava VM, Raghupathy P. 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 [place] Pediatr 1998; 35:775. 43. Moon JS, [place] Carolyne Fiscal YH, et al. 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 J Neurol Sci 2009; 277:181. 44. Keogh MA, Clinton Sawyer RM, Denaro CP. Kikuchi's disease associated with parotidomegaly, thyroiditis and a rash in a young man. Derry Lory Med 2000; 30:633. 45Aundria Rud CE, Nichol F. Kikuchi-Fujimoto disease associated with polymyositis. Rheumatology ([place]) 2000; 39:1302. 46. Pattricia Boss, Chang KC, Cheng CN, et al. 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 Haematologica 2000; 85:998. [address] Dorris Fetch, Allayne Butcher MK, Rassam SM. 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 Int J Clin Pract 2000; 54:547. 48. Alyse Low, Cho B, Chung NG. Hemophagocytic lymphohistiocytosis preceded by [name] disease in children. Pediatr Radiol 2008; 38:756. 49. Sarina Ill MJ, Harrel Lemon as a possible ophthalmic complication of Kikuchi-Fujimoto disease]. Arch Soc Esp Oftalmol 2005; 80:41. 50. Galor A, Georgy M, Leder HA, et al. Papillary conjunctivitis associated with [name] disease. Cornea 2008; 27:944. 51. Shusang V, Marelli L, Beynon H, et al. 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 Eur J Gastroenterol Hepatol 2008; 20:79. 52. Merceda Elks, et al. 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 Rinsho Shinkeigaku 1998; 38:941. 53. Longaretti P, Savasta S, Caimmi D, et al. 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 Pediatr Neurol 2012; 46:319. 54. de Larraaga GF, Remondino GI, Forastiero  RR, et al. 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 Lupus 2005; 14:967. 55. Andria Rhein, Fredrik Rigger, Busmanis I. Recurrent Kikuchi's disease. Lancet 1992; 340:124. 56. Ihor Gully. SLE Developing in a Follow-Up Patient of Kikuchi's Disease: A Rare Disorder. J Clin Diagn Res 2013; 7:752. 53. Mital D, Leland Her K. Kikuchi-Fujimoto syndrome presenting to a sexual health clinic. Int J STD AIDS 2009; 20:140. 58. Dorfman RF, Warnke R. Lymphadenopathy simulating the malignant lymphomas. Hum Pathol 1974; 5:519. 59. Nieman RB. Diagnosis of Kikuchi's disease. Lancet 9604; 540:981. 60. Kung IT, Ng WF, Tania Ade, Chan JK. Kikuchi's histiocytic necrotizing lymphadenitis. Diagnosis by fine needle aspiration. Acta Cytol 1990; 34:323. [address] [place]. Fine-needle aspiration cytologic diagnosis of Kikuchi's lymphadenitis. A report of 27 cases. Am J Clin Pathol 1994; 102:454. 62. Mannar GM, Boccato P, Rinaldo A, et al. 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 ORL J Otorhinolaryngol Relat Spec 1999; 61:367. 63. Delight Ovens M, et al. Apoptosis- and cell cycle-associated gene expression profiling of histiocytic necrotising lymphadenitis. Eur J Haematol 2004; 72:322. 64. Kuo TT, Lo SK. Significance of histological subtypes of Kikuchi's disease: comparative immunohistochemical and apoptotic studies. Pathol Int 2004; 54:237. 65. Cleaster Corin, et al. Cytologic features and frequency of plasmacytoid dendritic cells in the lymph nodes of patients with histiocytic necrotizing lymphadenitis (Kikuchi-Fujimoto disease). Diagn Cytopathol 2010; 38:521. 66Su Grand, Yvette Rack, In SI, et al. The cutaneous  lesions of Kikuchi's disease: a comprehensive analysis of 16 cases based on the clinicopathologic, immunohistochemical, and immunofluorescence studies with an emphasis  on the differential diagnosis. Hum Pathol 2010; 41:1245. 84Burman Blacksmith, Cecille Po, et al. CT findings in [place] disease: analysis of 96 cases. AJNR Am J Neuroradiol 2004; 25:1099. [address], Chong Sicilian, Mayer Masker, Kim MJ. Sonographic features of axillary lymphadenopathy caused by Kikuchi disease. J Ultrasound Med 2008; 27:847. [address] [place], [place]. Management of Kikuchi's disease using glucocorticoid. J Laryngol Otol 2000; 114:709. 70. Lin DY, Villegas MS, Tan PL, et al. Severe Kikuchi's disease responsive to immune modulation. [place] Med J 2010; 51:e18. 71. Rezai K, Kuchipudi S, Chundi V, et al. Kikuchi-Fujimoto disease: hydroxychloroquine as a treatment. Clin Infect Dis 2004; 39:e124. 72. Rosemarie Beath, Brita Romp, [place] DW, et al. Clinical outcome and predictive factors of recurrence among patients with Kikuchi's disease. Int Marena Chancy Dis 2009; 13:322. Topic 5507 Version 11.0  Subscription and License Agreement  Policies  Support Tag  Facebook Twitter LinkedIn YouTube  [employer] [facility]  Using UpToDate Contact us Help  [employer] UpToDate News  Access Options for UpToDate Training Center   2014 UpToDate[employer]. All rights reserved. [employer] [facility]  Facts & Comparisons  Health Language  Lexicomp  Medi-Span  [employer]  [facility]  [employer]  ProVation Order Sets  Release: 22.6 Content: C22.128 Licensed To: [facility]  Support Tag: [[id number]] Enter case reference ID  No case reference ID? Contact us

## 2012-07-16 ENCOUNTER — Telehealth: Payer: Self-pay | Admitting: *Deleted

## 2012-07-16 LAB — CBC WITH DIFFERENTIAL/PLATELET
Basophils Absolute: 0 10*3/uL (ref 0.0–0.1)
Eosinophils Absolute: 0.1 10*3/uL (ref 0.0–0.7)
Eosinophils Relative: 3 % (ref 0–5)
Lymphs Abs: 0.8 10*3/uL (ref 0.7–4.0)
MCH: 28 pg (ref 26.0–34.0)
MCHC: 33.8 g/dL (ref 30.0–36.0)
MCV: 82.7 fL (ref 78.0–100.0)
Monocytes Absolute: 0.3 10*3/uL (ref 0.1–1.0)
Neutrophils Relative %: 30 % — ABNORMAL LOW (ref 43–77)
Platelets: 135 10*3/uL — ABNORMAL LOW (ref 150–400)
RDW: 12.6 % (ref 11.5–15.5)

## 2012-07-16 MED ORDER — OXYCODONE-ACETAMINOPHEN 5-325 MG PO TABS: 1.0000 | ORAL_TABLET | ORAL | Status: DC | PRN

## 2012-07-16 MED ORDER — ENSURE COMPLETE PO LIQD: 237.0000 mL | Freq: Two times a day (BID) | ORAL | Status: DC

## 2012-07-16 NOTE — Progress Notes (Signed)
Patient discharged to home with friend, discharge instructions reviewed with patient who verbalized understanding. New RX's given to patient.  

## 2012-07-16 NOTE — Discharge Summary (Signed)
Physician Discharge Summary  Calvin Wolf ZOX:[id number] DOB: 1990/09/12 DOA: 07/11/2012  PCP: No primary provider on file.  Admit date: 07/11/2012 Discharge date: 07/16/2012  Time spent: 30 minutes  Recommendations for Outpatient Follow-up:  1. Follow up with Dr Calvin Wolf for final result of biopsy and cultures pending.  2. Need repeat cbc.   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 States the mass started 3 weeks ago, was big when he first noticed it but over the last several days it has become more painful. No drainage from lump but is quite painful to touch. Had chills yesterday and noted to be febrile today in office with wbc of 1.7. Patient denies any n /v/d. No rashes. No sore throat. No recent uri or other gi illnesses. No dysuria, No penile rashes or discharge. His last sexual activity was about 2 months ago. No weight loss. No cp, no sob, no cough. No le edema or swelling. No abd pain.    Hospital Course:  1-Neutropenic fever: with enlarged lymph node. Patients presents with fever, neutropenia, right cervical lymphadenopathy.   -preliminary report from lymph node biopsy show [name] 's diseases. Discussed care with Dr Calvin Wolf, if WBC stable tomorrow and if OK with ID will plan to discharge patient home 7-12. Patient will follow with Dr Calvin Wolf on 7-23.  -Patient received continue zosyn and doxycycline for 5 days.  -No antibiotics needed per ID recommendation.  -Vancomycin stopped 7-9.  -Ehrlichia pending , Cat scratch negative, quantiferon/TB negative. .  -RPR non reactive, malaria smear  negative, EBV IgG more 750, toxo negative, RMSF less than 0.06, HIV RNA less than 20, CMV IgM less than 8  -Blood culture no growth to date.  -Patient had  lymph node Biopsy 7-10.   2-Thrombocytopenia: most likely secondary to infection/ongoing process.  -will avoid heparin products and follow trend.  3-Soft BP: Continue to monitor. Lactic acid 0.8. cortisol level 4.2. TSH. Continue with IV fluids. BP in the 100 range, likely baseline.  4-DVT:SCD's   Procedures: lymph node Biopsy 7-10.   Consultations:  DR Calvin Wolf.   Dr Calvin Wolf  Discharge Exam: Filed Vitals:   07/15/12 0640 07/15/12 1500 07/15/12 2048 07/16/12 0503  BP: 107/54 93/48 100/49 107/56  Pulse: 62 73 66 58  Temp:  97.6 F (36.4 C) 98 F (36.7 C) 97.8 F (36.6 C)  TempSrc:  Oral Oral Oral  Resp:  16 15   Height:      Weight:      SpO2:  100% 99% 98%    General: No distress.  Cardiovascular: S 1, S 2 RRR Respiratory: CTA  Discharge Instructions  Discharge Orders   Future Orders Complete By Expires     Diet general  As directed     Increase activity slowly  As directed         Medication List         feeding supplement Liqd  Take 237 mLs by mouth 2 (two) times daily between meals.     oxyCODONE-acetaminophen 5-325 MG per tablet  Commonly known as:  PERCOCET/ROXICET  Take 1 tablet by mouth every 4 (four) hours as needed.       No Known Allergies     Follow-up Information   Follow up with Lowella Dell, MD. (appointmet on 7-23. )    Contact information:   [address] [place] [address] [phone]        The results of significant diagnostics from this hospitalization (including imaging, microbiology, ancillary and laboratory) are listed below for reference.    Significant Diagnostic Studies: Ct Soft Tissue Neck W Contrast  07/12/2012   *RADIOLOGY REPORT*  Clinical Data:  Fever and neutropenia.  Mass in the right side of the neck.  Mass is painful to touch.  CT NECK,  CHEST, ABDOMEN AND PELVIS WITH CONTRAST  Technique:  Multidetector CT imaging of the neck, chest, abdomen and pelvis was performed using the standard protocol following the bolus administration of intravenous contrast.  Contrast: 50mL OMNIPAQUE IOHEXOL 300 MG/ML  SOLN, OMNIPAQUE IOHEXOL 300 MG/ML  SOLN  Comparison:   None.  CT NECK  Findings:  There is right-sided cervical and supraclavicular lymphadenopathy with enhancing lymph nodes demonstrated IIA, IIB, III, IV and V levels.  Cervical lymph nodes measure up to about 1.1 x 2.5 cm diameter.  Supraclavicular lymph nodes measure up to about 0.9 x 1.4 cm.  No significant lymphadenopathy is demonstrated on the left.  Changes could be due to infectious or inflammatory process, lymphoma, or metastasis.  Tonsillar adenoidal tissues are not enlarged.  No mucosal space or prevertebral lesions demonstrated.  Salivary glands appear homogeneous and symmetrical. Carotid and jugular vessels are patent and not effaced.  Laryngeal structures appear intact.  Cervical airway appears patent.  Thyroid gland appears homogeneous.  Normal alignment of the cervical vertebrae.  No destructive bone lesions appreciated.  IMPRESSION: Fairly extensive lymphadenopathy along the right cervical and supraclavicular regions.  Changes could represent lymphoma, infectious or inflammatory process, or metastasis.  CT CHEST  Findings: Right-sided supraclavicular lymphadenopathy as previously discussed.  See above.  No significant axillary, mediastinal, or hilar lymphadenopathy on either side.  Normal heart size.  Normal caliber thoracic aorta.  No mediastinal mass lesion.  Esophagus is decompressed.  Small calcified lymph node in the middle mediastinum to the right of the right mainstem bronchus.  No pleural effusions. Lungs appear clear.  No focal lung mass, consolidation, airspace disease, or interstitial infiltration.  Airways appear patent.  No pneumothorax.  Normal alignment of the thoracic  spine.  No destructive bone lesions are appreciated.  IMPRESSION: Right supraclavicular lymphadenopathy.  Chest is otherwise negative.  CT ABDOMEN AND PELVIS  Findings: Spleen is mildly enlarged, measuring about 5.8 x 10.5 x 13.6 cm.  Normal homogeneous parenchymal echotexture.  The liver, gallbladder, pancreas, adrenal glands, kidneys, abdominal aorta, and inferior vena cava are unremarkable.  Mesenteric and retroperitoneal lymph nodes are present but without pathologic enlargement.  The stomach, small bowel, and colon are decompressed. No free air or free fluid in the abdomen.  Abdominal wall musculature appears intact.  Pelvis:  The prostate gland is not enlarged.  Bladder wall is not thickened.  Stool filled rectosigmoid colon without diverticulitis. The appendix is normal.  No free or loculated pelvic fluid collections.  No significant lymphadenopathy in the pelvis or groin regions.  Visualized growing nodes are not pathologically enlarged. Normal alignment of the lumbar vertebra.  No destructive bone lesions appreciated.  IMPRESSION: Mild splenic enlargement.  No significant lymphadenopathy in the abdomen or pelvis.   Original Report Authenticated By:  Burman Nieves, M.D.   Ct Chest W Contrast  07/12/2012   *RADIOLOGY REPORT*  Clinical Data:  Fever and neutropenia.  Mass in the right side of the neck.  Mass is painful to touch.  CT NECK, CHEST, ABDOMEN AND PELVIS WITH CONTRAST  Technique:  Multidetector CT imaging of the neck, chest, abdomen and pelvis was performed using the standard protocol following the bolus administration of intravenous contrast.  Contrast: 50mL OMNIPAQUE IOHEXOL 300 MG/ML  SOLN, OMNIPAQUE IOHEXOL 300 MG/ML  SOLN  Comparison:   None.  CT NECK  Findings:  There is right-sided cervical and supraclavicular lymphadenopathy with enhancing lymph nodes demonstrated IIA, IIB, III, IV and V levels.  Cervical lymph nodes measure up to about 1.1 x 2.5 cm diameter.  Supraclavicular lymph  nodes measure up to about 0.9 x 1.4 cm.  No significant lymphadenopathy is demonstrated on the left.  Changes could be due to infectious or inflammatory process, lymphoma, or metastasis.  Tonsillar adenoidal tissues are not enlarged.  No mucosal space or prevertebral lesions demonstrated.  Salivary glands appear homogeneous and symmetrical. Carotid and jugular vessels are patent and not effaced.  Laryngeal structures appear intact.  Cervical airway appears patent.  Thyroid gland appears homogeneous.  Normal alignment of the cervical vertebrae.  No destructive bone lesions appreciated.  IMPRESSION: Fairly extensive lymphadenopathy along the right cervical and supraclavicular regions.  Changes could represent lymphoma, infectious or inflammatory process, or metastasis.  CT CHEST  Findings: Right-sided supraclavicular lymphadenopathy as previously discussed.  See above.  No significant axillary, mediastinal, or hilar lymphadenopathy on either side.  Normal heart size.  Normal caliber thoracic aorta.  No mediastinal mass lesion.  Esophagus is decompressed.  Small calcified lymph node in the middle mediastinum to the right of the right mainstem bronchus.  No pleural effusions. Lungs appear clear.  No focal lung mass, consolidation, airspace disease, or interstitial infiltration.  Airways appear patent.  No pneumothorax.  Normal alignment of the thoracic spine.  No destructive bone lesions are appreciated.  IMPRESSION: Right supraclavicular lymphadenopathy.  Chest is otherwise negative.  CT ABDOMEN AND PELVIS  Findings: Spleen is mildly enlarged, measuring about 5.8 x 10.5 x 13.6 cm.  Normal homogeneous parenchymal echotexture.  The liver, gallbladder, pancreas, adrenal glands, kidneys, abdominal aorta, and inferior vena cava are unremarkable.  Mesenteric and retroperitoneal lymph nodes are present but without pathologic enlargement.  The stomach, small bowel, and colon are decompressed. No free air or free fluid in the  abdomen.  Abdominal wall musculature appears intact.  Pelvis:  The prostate gland is not enlarged.  Bladder wall is not thickened.  Stool filled rectosigmoid colon without diverticulitis. The appendix is normal.  No free or loculated pelvic fluid collections.  No significant lymphadenopathy in the pelvis or groin regions.  Visualized growing nodes are not pathologically enlarged. Normal alignment of the lumbar vertebra.  No destructive bone lesions appreciated.  IMPRESSION: Mild splenic enlargement.  No significant lymphadenopathy in the abdomen or pelvis.   Original Report Authenticated By: Burman Nieves, M.D.   Ct Abdomen Pelvis W Contrast  07/12/2012   *RADIOLOGY REPORT*  Clinical Data:  Fever and neutropenia.  Mass in the right side of the neck.  Mass is painful to touch.  CT NECK, CHEST, ABDOMEN AND PELVIS WITH CONTRAST  Technique:  Multidetector CT imaging of the neck, chest, abdomen and pelvis was performed using the standard protocol following the bolus administration of intravenous contrast.  Contrast: 50mL OMNIPAQUE IOHEXOL 300 MG/ML  SOLN,  OMNIPAQUE IOHEXOL 300 MG/ML  SOLN  Comparison:   None.  CT NECK  Findings:  There is right-sided cervical and supraclavicular lymphadenopathy with enhancing lymph nodes demonstrated IIA, IIB, III, IV and V levels.  Cervical lymph nodes measure up to about 1.1 x 2.5 cm diameter.  Supraclavicular lymph nodes measure up to about 0.9 x 1.4 cm.  No significant lymphadenopathy is demonstrated on the left.  Changes could be due to infectious or inflammatory process, lymphoma, or metastasis.  Tonsillar adenoidal tissues are not enlarged.  No mucosal space or prevertebral lesions demonstrated.  Salivary glands appear homogeneous and symmetrical. Carotid and jugular vessels are patent and not effaced.  Laryngeal structures appear intact.  Cervical airway appears patent.  Thyroid gland appears homogeneous.  Normal alignment of the cervical vertebrae.  No destructive bone  lesions appreciated.  IMPRESSION: Fairly extensive lymphadenopathy along the right cervical and supraclavicular regions.  Changes could represent lymphoma, infectious or inflammatory process, or metastasis.  CT CHEST  Findings: Right-sided supraclavicular lymphadenopathy as previously discussed.  See above.  No significant axillary, mediastinal, or hilar lymphadenopathy on either side.  Normal heart size.  Normal caliber thoracic aorta.  No mediastinal mass lesion.  Esophagus is decompressed.  Small calcified lymph node in the middle mediastinum to the right of the right mainstem bronchus.  No pleural effusions. Lungs appear clear.  No focal lung mass, consolidation, airspace disease, or interstitial infiltration.  Airways appear patent.  No pneumothorax.  Normal alignment of the thoracic spine.  No destructive bone lesions are appreciated.  IMPRESSION: Right supraclavicular lymphadenopathy.  Chest is otherwise negative.  CT ABDOMEN AND PELVIS  Findings: Spleen is mildly enlarged, measuring about 5.8 x 10.5 x 13.6 cm.  Normal homogeneous parenchymal echotexture.  The liver, gallbladder, pancreas, adrenal glands, kidneys, abdominal aorta, and inferior vena cava are unremarkable.  Mesenteric and retroperitoneal lymph nodes are present but without pathologic enlargement.  The stomach, small bowel, and colon are decompressed. No free air or free fluid in the abdomen.  Abdominal wall musculature appears intact.  Pelvis:  The prostate gland is not enlarged.  Bladder wall is not thickened.  Stool filled rectosigmoid colon without diverticulitis. The appendix is normal.  No free or loculated pelvic fluid collections.  No significant lymphadenopathy in the pelvis or groin regions.  Visualized growing nodes are not pathologically enlarged. Normal alignment of the lumbar vertebra.  No destructive bone lesions appreciated.  IMPRESSION: Mild splenic enlargement.  No significant lymphadenopathy in the abdomen or pelvis.    Original Report Authenticated By: Burman Nieves, M.D.   US Biopsy  07/14/2012   *RADIOLOGY REPORT*  Clinical Data: Right cervical adenopathy  ULTRASOUND RIGHT CERVICAL ADENOPATHY 18 GAUGE CORE BIOPSY  Date:  07/14/2012 10:55:00  Radiologist:  M. Ruel Favors, M.D.  Medications:  1% lidocaine locally  Guidance:  Ultrasound  Complications:  No immediate  PROCEDURE/FINDINGS:  Informed consent was obtained from the patient following explanation of the procedure, risks, benefits and alternatives. The patient understands, agrees and consents for the procedure. All questions were addressed.  A time out was performed.  Maximal barrier sterile technique utilized including caps, mask, sterile gowns, sterile gloves, large sterile drape, hand hygiene, and betadine  Previous imaging reviewed.  Preliminary ultrasound performed. Right cervical adenopathy demonstrated.  Overlying skin marked. Under sterile conditions and local anesthesia, an 18 gauge core needle was advanced into the right cervical adenopathy.  Several 1 cm 18 gauge core biopsies were obtained of the abnormal adenopathy under direct  ultrasound.  These biopsies were placed in saline. Post procedure imaging demonstrates no evidence of hemorrhage or hematoma.  The patient tolerated the biopsy well.  IMPRESSION: Successful right cervical adenopathy 18 gauge core biopsies   Original Report Authenticated By: Judie Petit. Miles Costain, M.D.    Microbiology: Recent Results (from the past 240 hour(s))  MRSA PCR SCREENING     Status: None   Collection Time    07/11/12  8:47 PM      Result Value Range Status   MRSA by PCR NEGATIVE  NEGATIVE Final   Comment:            The GeneXpert MRSA Assay (FDA     approved for NASAL specimens     only), is one component of a     comprehensive MRSA colonization     surveillance program. It is not     intended to diagnose MRSA     infection nor to guide or     monitor treatment for     MRSA infections.  CULTURE, BLOOD (ROUTINE X 2)      Status: None   Collection Time    07/11/12 10:50 PM      Result Value Range Status   Specimen Description BLOOD RIGHT ARM   Final   Special Requests BOTTLES DRAWN AEROBIC AND ANAEROBIC 5CC   Final   Culture  Setup Time 07/12/2012 05:56   Final   Culture     Final   Value:        BLOOD CULTURE RECEIVED NO GROWTH TO DATE CULTURE WILL BE HELD FOR 5 DAYS BEFORE ISSUING A FINAL NEGATIVE REPORT   Report Status PENDING   Incomplete  CULTURE, BLOOD (ROUTINE X 2)     Status: None   Collection Time    07/11/12 10:55 PM      Result Value Range Status   Specimen Description BLOOD LEFT ARM   Final   Special Requests BOTTLES DRAWN AEROBIC AND ANAEROBIC 2CC   Final   Culture  Setup Time 07/12/2012 05:57   Final   Culture     Final   Value:        BLOOD CULTURE RECEIVED NO GROWTH TO DATE CULTURE WILL BE HELD FOR 5 DAYS BEFORE ISSUING A FINAL NEGATIVE REPORT   Report Status PENDING   Incomplete  MALARIA SMEAR     Status: None   Collection Time    07/12/12  9:15 AM      Result Value Range Status   Specimen Description BLOOD   Final   Special Requests Immunocompromised   Final   Malaria Prep     Final   Value: No Plasmodium or Other Blood Parasites Seen on Thick or Thin Smears For persons strongly suspected of having a blood parasite,but have negative smears, it is recommended that blood films be repeated approximately every 12 to 24 hours for 3 consecutive      days.   Report Status 07/13/2012 FINAL   Final  AFB CULTURE WITH SMEAR     Status: None   Collection Time    07/14/12 11:30 AM      Result Value Range Status   Specimen Description LYMPH NODE   Final   Special Requests Immunocompromised   Final   ACID FAST SMEAR NO ACID FAST BACILLI SEEN   Final   Culture     Final   Value: CULTURE WILL BE EXAMINED FOR 6 WEEKS BEFORE ISSUING A FINAL REPORT   Report Status  PENDING   Incomplete  FUNGUS CULTURE W SMEAR     Status: None   Collection Time    07/14/12 11:30 AM      Result Value Range  Status   Specimen Description LYMPH NODE   Final   Special Requests Immunocompromised   Final   Fungal Smear NO YEAST OR FUNGAL ELEMENTS SEEN   Final   Culture CULTURE IN PROGRESS FOR FOUR WEEKS   Final   Report Status PENDING   Incomplete  TISSUE CULTURE     Status: None   Collection Time    07/14/12 11:30 AM      Result Value Range Status   Specimen Description LYMPH NODE   Final   Special Requests NONE   Final   Gram Stain     Final   Value: RARE WBC PRESENT, PREDOMINANTLY MONONUCLEAR     NO ORGANISMS SEEN   Culture NO GROWTH 1 DAY   Final   Report Status PENDING   Incomplete     Labs: Basic Metabolic Panel:  Recent Labs Lab 07/11/12 2011 07/13/12 0505 07/14/12 0320  NA 135 137 137  K 3.8 3.5 3.6  CL 99 100 101  CO2 29 29 28   GLUCOSE 81 99 86  BUN 10 7 6   CREATININE 0.82 0.76 0.75  CALCIUM 9.1 9.0 8.9   Liver Function Tests:  Recent Labs Lab 07/11/12 2011 07/13/12 0505  AST 24 21  ALT 16 15  ALKPHOS 70 67  BILITOT 0.3 0.4  PROT 7.2 7.3  ALBUMIN 3.7 3.6   No results found for this basename: LIPASE, AMYLASE,  in the last 168 hours No results found for this basename: AMMONIA,  in the last 168 hours CBC:  Recent Labs Lab 07/11/12 2010 07/13/12 0505 07/14/12 0320 07/15/12 0345 07/16/12 0412  WBC 1.4* 1.7* 1.3* 1.8* 1.7*  NEUTROABS 0.5* 0.5* 0.4* 0.6* 0.5*  HGB 13.5 13.1 13.3 13.3 13.6  HCT 40.3 38.8* 39.4 40.0 40.2  MCV 83.6 84.0 83.8 82.5 82.7  PLT 124* 116* 116* 132* 135*   Cardiac Enzymes: No results found for this basename: CKTOTAL, CKMB, CKMBINDEX, TROPONINI,  in the last 168 hours BNP: BNP (last 3 results) No results found for this basename: PROBNP,  in the last 8760 hours CBG: No results found for this basename: GLUCAP,  in the last 168 hours     Signed:  Haleigh Desmith  [facility] 07/16/2012, 7:53 AM

## 2012-07-16 NOTE — Telephone Encounter (Signed)
Lm informing the pt tht GCM would like to see him on 07/27/12 w/ labs @4pm  and ov @ 4:30pm. Pt is aware that i will mail a letter/cal...td

## 2012-07-17 LAB — TISSUE CULTURE

## 2012-07-18 LAB — CULTURE, BLOOD (ROUTINE X 2): Culture: NO GROWTH

## 2012-07-18 LAB — ANA: Anti Nuclear Antibody(ANA): NEGATIVE

## 2012-07-19 ENCOUNTER — Ambulatory Visit: Payer: Managed Care, Other (non HMO) | Admitting: Oncology

## 2012-07-19 ENCOUNTER — Other Ambulatory Visit: Payer: Managed Care, Other (non HMO) | Admitting: Lab

## 2012-07-27 ENCOUNTER — Other Ambulatory Visit (HOSPITAL_BASED_OUTPATIENT_CLINIC_OR_DEPARTMENT_OTHER): Payer: Managed Care, Other (non HMO)

## 2012-07-27 ENCOUNTER — Other Ambulatory Visit (HOSPITAL_COMMUNITY): Payer: Self-pay | Admitting: Oncology

## 2012-07-27 ENCOUNTER — Ambulatory Visit (HOSPITAL_BASED_OUTPATIENT_CLINIC_OR_DEPARTMENT_OTHER): Payer: Managed Care, Other (non HMO) | Admitting: Oncology

## 2012-07-27 VITALS — BP 96/62 | HR 78 | Temp 98.5°F | Resp 20 | Ht 67.0 in | Wt 126.7 lb

## 2012-07-27 DIAGNOSIS — I889 Nonspecific lymphadenitis, unspecified: Secondary | ICD-10-CM

## 2012-07-27 DIAGNOSIS — D709 Neutropenia, unspecified: Secondary | ICD-10-CM

## 2012-07-27 LAB — CBC WITH DIFFERENTIAL/PLATELET
BASO%: 1 % (ref 0.0–2.0)
HCT: 43.3 % (ref 38.4–49.9)
MCHC: 33.5 g/dL (ref 32.0–36.0)
MONO#: 0.3 10*3/uL (ref 0.1–0.9)
NEUT%: 30.4 % — ABNORMAL LOW (ref 39.0–75.0)
RBC: 5.17 10*6/uL (ref 4.20–5.82)
WBC: 1.9 10*3/uL — ABNORMAL LOW (ref 4.0–10.3)
lymph#: 1 10*3/uL (ref 0.9–3.3)
nRBC: 0 % (ref 0–0)

## 2012-07-27 NOTE — Progress Notes (Signed)
ID: Calvin Wolf OB: 1990-12-12  MR#: [id number]  CSN#:[id number]  PCP: No PCP Per Patient GYN:   SU:  OTHER MD: Warner Mccreedy   HISTORY OF PRESENT ILLNESS: I was called on 07/11/2012 by Dr. Shanda Bumps Copland regarding this 22 year old patient she saw in urgent care, presenting with a fever greater than 101, left cervical adenopathy, and neutropenia. The patient was admitted, started on Zosyn and vancomycin empirically, and CT scans of the chest abdomen and pelvis on 07/12/2012 showed right cervical and supraclavicular adenopathy, with mild splenomegaly, but no other abnormalities.  Hematology workup included a normal LDH, a negative ANA, and a review of the Wolf film which confirmed the neutropenia but was otherwise nondiagnostic. Infectious diseases was consulted and a very extensive workup for viral, parasitic, tuberculous, or bacterial infection was carried out under Dr. Trey Paula Hatcher's direction. This was all unrevealing.  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 The interval history is unremarkable. He continues to attend school,. He feels "good". He is not exercising and particularly not doing soccer because of RAamadan.   REVIEW OF SYSTEMS: He denies any fever and indeed by the time he had been discharged from the hospital he was afebrile. He denies any rash or bleeding. He feels the lymph nodes in his right neck and supraclavicular area are smaller. Sometimes she has low back pain but he is learning some exercises for that. A detailed review of systems today was otherwise noncontributory  PAST MEDICAL HISTORY: Past Medical History   Diagnosis Date  . Anemia age 53    Pt states "I had before anemia"    PAST SURGICAL HISTORY: Past Surgical History  Procedure Laterality Date  . No past surgeries      FAMILY HISTORY No family history on file.  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 He hopes to become a nurse eventually. He is the ninth of 10 children. He is originally from [place]    ADVANCED DIRECTIVES:    HEALTH MAINTENANCE: History  Substance Use Topics  . Smoking status: Current Every Day Smoker -- 1.00 packs/day for .5 years  . Smokeless tobacco: Never Used  . Alcohol Use: Yes     Comment: "a little bit"  last time was in June 2014     Colonoscopy:  PAP:  Bone density:  Lipid panel:  No Known Allergies  Current Outpatient Prescriptions  Medication Sig Dispense Refill  . feeding supplement (ENSURE COMPLETE) LIQD Take 237 mLs by mouth 2 (two) times daily between meals.  30 Bottle  0  . oxyCODONE-acetaminophen (PERCOCET/ROXICET) 5-325 MG per tablet Take 1 tablet by mouth every 4 (four) hours as needed.  30 tablet  0   No current facility-administered medications for this visit.    OBJECTIVE: Young [place] male who appears well Filed Vitals:   07/27/12 1600  BP: 96/62  Pulse: 78  Temp: 98.5 F (36.9 C)  Resp: 20     Body mass index is 19.84 kg/(m^2).    ECOG FS: 0  Sclerae unicteric Oropharynx clear I do not palpate any cervical adenopathy. In the right supraclavicular area there is a movable lymph node which is fairly  flat and measures approximately 1 cm. I do not palpate any other cervical or supraclavicular adenopathy and both axillae are benign. Lungs no rales or rhonchi Heart regular rate and rhythm, no murmur appreciated Abd soft, nontender, positive bowel sounds, no splenomegaly palpable MSK no focal spinal tenderness, no peripheral edema Neuro: non-focal, well-oriented, pleasant affect    LAB RESULTS:  CMP      Component Value Date/Time   NA 137 07/14/2012 0320   K 3.6 07/14/2012 0320   CL 101 07/14/2012 0320   CO2 28 07/14/2012 0320   GLUCOSE 86 07/14/2012 0320   BUN 6 07/14/2012 0320   CREATININE 0.75 07/14/2012 0320   CALCIUM 8.9 07/14/2012 0320   PROT 7.3 07/13/2012 0505   ALBUMIN 3.6 07/13/2012 0505   AST 21 07/13/2012 0505   ALT 15 07/13/2012 0505   ALKPHOS 67 07/13/2012 0505   BILITOT 0.4 07/13/2012 0505   GFRNONAA >90 07/14/2012 0320   GFRAA >90 07/14/2012 0320    I No results found for this basename: SPEP, UPEP,  kappa and lambda light chains    Lab Results  Component Value Date   WBC 1.9* 07/27/2012   NEUTROABS 0.6* 07/27/2012   HGB 14.5 07/27/2012   HCT 43.3 07/27/2012   MCV 83.8 07/27/2012   PLT 215 07/27/2012      Chemistry      Component Value Date/Time   NA 137 07/14/2012 0320   K 3.6 07/14/2012 0320   CL 101 07/14/2012 0320   CO2 28 07/14/2012 0320   BUN 6 07/14/2012 0320   CREATININE 0.75 07/14/2012 0320      Component Value Date/Time   CALCIUM 8.9 07/14/2012 0320   ALKPHOS 67 07/13/2012 0505   AST 21 07/13/2012 0505   ALT 15 07/13/2012 0505   BILITOT 0.4 07/13/2012 0505       No results found for this basename: LABCA2    No components found with this basename: LABCA125    No results found for this basename: INR,  in the last 168 hours  Urinalysis    Component Value Date/Time   COLORURINE YELLOW 07/11/2012 2214   APPEARANCEUR CLEAR 07/11/2012 2214   LABSPEC 1.026 07/11/2012 2214   PHURINE 5.5 07/11/2012 2214   GLUCOSEU NEGATIVE 07/11/2012 2214   HGBUR NEGATIVE 07/11/2012 2214   BILIRUBINUR NEGATIVE 07/11/2012 2214   KETONESUR NEGATIVE 07/11/2012 2214   PROTEINUR NEGATIVE 07/11/2012 2214   UROBILINOGEN 1.0 07/11/2012 2214   NITRITE NEGATIVE 07/11/2012 2214   LEUKOCYTESUR NEGATIVE 07/11/2012 2214    STUDIES: Ct Soft Tissue Neck W Contrast  07/12/2012   *RADIOLOGY REPORT*  Clinical Data:  Fever and neutropenia.  Mass in the right side of the neck.  Mass is painful to touch.  CT NECK, CHEST,  ABDOMEN AND PELVIS WITH CONTRAST  Technique:  Multidetector CT imaging of the neck, chest, abdomen and pelvis was performed using the standard protocol following the bolus administration of intravenous contrast.  Contrast: 50mL OMNIPAQUE IOHEXOL 300 MG/ML  SOLN, OMNIPAQUE IOHEXOL 300 MG/ML  SOLN  Comparison:   None.  CT NECK  Findings:  There is right-sided cervical and supraclavicular lymphadenopathy with enhancing lymph nodes demonstrated IIA, IIB, III, IV and V levels.  Cervical lymph nodes measure up to about 1.1 x 2.5 cm diameter.  Supraclavicular lymph nodes measure up to about 0.9 x 1.4 cm.  No significant lymphadenopathy is demonstrated on the left.  Changes could be due to infectious or inflammatory process, lymphoma, or metastasis.  Tonsillar  adenoidal tissues are not enlarged.  No mucosal space or prevertebral lesions demonstrated.  Salivary glands appear homogeneous and symmetrical. Carotid and jugular vessels are patent and not effaced.  Laryngeal structures appear intact.  Cervical airway appears patent.  Thyroid gland appears homogeneous.  Normal alignment of the cervical vertebrae.  No destructive bone lesions appreciated.  IMPRESSION: Fairly extensive lymphadenopathy along the right cervical and supraclavicular regions.  Changes could represent lymphoma, infectious or inflammatory process, or metastasis.  CT CHEST  Findings: Right-sided supraclavicular lymphadenopathy as previously discussed.  See above.  No significant axillary, mediastinal, or hilar lymphadenopathy on either side.  Normal heart size.  Normal caliber thoracic aorta.  No mediastinal mass lesion.  Esophagus is decompressed.  Small calcified lymph node in the middle mediastinum to the right of the right mainstem bronchus.  No pleural effusions. Lungs appear clear.  No focal lung mass, consolidation, airspace disease, or interstitial infiltration.  Airways appear patent.  No pneumothorax.  Normal alignment of the thoracic spine.   No destructive bone lesions are appreciated.  IMPRESSION: Right supraclavicular lymphadenopathy.  Chest is otherwise negative.  CT ABDOMEN AND PELVIS  Findings: Spleen is mildly enlarged, measuring about 5.8 x 10.5 x 13.6 cm.  Normal homogeneous parenchymal echotexture.  The liver, gallbladder, pancreas, adrenal glands, kidneys, abdominal aorta, and inferior vena cava are unremarkable.  Mesenteric and retroperitoneal lymph nodes are present but without pathologic enlargement.  The stomach, small bowel, and colon are decompressed. No free air or free fluid in the abdomen.  Abdominal wall musculature appears intact.  Pelvis:  The prostate gland is not enlarged.  Bladder wall is not thickened.  Stool filled rectosigmoid colon without diverticulitis. The appendix is normal.  No free or loculated pelvic fluid collections.  No significant lymphadenopathy in the pelvis or groin regions.  Visualized growing nodes are not pathologically enlarged. Normal alignment of the lumbar vertebra.  No destructive bone lesions appreciated.  IMPRESSION: Mild splenic enlargement.  No significant lymphadenopathy in the abdomen or pelvis.   Original Report Authenticated By: Burman Nieves, M.D.   Ct Chest W Contrast  07/12/2012   *RADIOLOGY REPORT*  Clinical Data:  Fever and neutropenia.  Mass in the right side of the neck.  Mass is painful to touch.  CT NECK, CHEST, ABDOMEN AND PELVIS WITH CONTRAST  Technique:  Multidetector CT imaging of the neck, chest, abdomen and pelvis was performed using the standard protocol following the bolus administration of intravenous contrast.  Contrast: 50mL OMNIPAQUE IOHEXOL 300 MG/ML  SOLN, OMNIPAQUE IOHEXOL 300 MG/ML  SOLN  Comparison:   None.  CT NECK  Findings:  There is right-sided cervical and supraclavicular lymphadenopathy with enhancing lymph nodes demonstrated IIA, IIB, III, IV and V levels.  Cervical lymph nodes measure up to about 1.1 x 2.5 cm diameter.  Supraclavicular lymph nodes  measure up to about 0.9 x 1.4 cm.  No significant lymphadenopathy is demonstrated on the left.  Changes could be due to infectious or inflammatory process, lymphoma, or metastasis.  Tonsillar adenoidal tissues are not enlarged.  No mucosal space or prevertebral lesions demonstrated.  Salivary glands appear homogeneous and symmetrical. Carotid and jugular vessels are patent and not effaced.  Laryngeal structures appear intact.  Cervical airway appears patent.  Thyroid gland appears homogeneous.  Normal alignment of the cervical vertebrae.  No destructive bone lesions appreciated.  IMPRESSION: Fairly extensive lymphadenopathy along the right cervical and supraclavicular regions.  Changes could represent lymphoma, infectious or inflammatory process, or metastasis.  CT  CHEST  Findings: Right-sided supraclavicular lymphadenopathy as previously discussed.  See above.  No significant axillary, mediastinal, or hilar lymphadenopathy on either side.  Normal heart size.  Normal caliber thoracic aorta.  No mediastinal mass lesion.  Esophagus is decompressed.  Small calcified lymph node in the middle mediastinum to the right of the right mainstem bronchus.  No pleural effusions. Lungs appear clear.  No focal lung mass, consolidation, airspace disease, or interstitial infiltration.  Airways appear patent.  No pneumothorax.  Normal alignment of the thoracic spine.  No destructive bone lesions are appreciated.  IMPRESSION: Right supraclavicular lymphadenopathy.  Chest is otherwise negative.  CT ABDOMEN AND PELVIS  Findings: Spleen is mildly enlarged, measuring about 5.8 x 10.5 x 13.6 cm.  Normal homogeneous parenchymal echotexture.  The liver, gallbladder, pancreas, adrenal glands, kidneys, abdominal aorta, and inferior vena cava are unremarkable.  Mesenteric and retroperitoneal lymph nodes are present but without pathologic enlargement.  The stomach, small bowel, and colon are decompressed. No free air or free fluid in the  abdomen.  Abdominal wall musculature appears intact.  Pelvis:  The prostate gland is not enlarged.  Bladder wall is not thickened.  Stool filled rectosigmoid colon without diverticulitis. The appendix is normal.  No free or loculated pelvic fluid collections.  No significant lymphadenopathy in the pelvis or groin regions.  Visualized growing nodes are not pathologically enlarged. Normal alignment of the lumbar vertebra.  No destructive bone lesions appreciated.  IMPRESSION: Mild splenic enlargement.  No significant lymphadenopathy in the abdomen or pelvis.   Original Report Authenticated By: Burman Nieves, M.D.   Ct Abdomen Pelvis W Contrast  07/12/2012   *RADIOLOGY REPORT*  Clinical Data:  Fever and neutropenia.  Mass in the right side of the neck.  Mass is painful to touch.  CT NECK, CHEST, ABDOMEN AND PELVIS WITH CONTRAST  Technique:  Multidetector CT imaging of the neck, chest, abdomen and pelvis was performed using the standard protocol following the bolus administration of intravenous contrast.  Contrast: 50mL OMNIPAQUE IOHEXOL 300 MG/ML  SOLN, OMNIPAQUE IOHEXOL 300 MG/ML  SOLN  Comparison:   None.  CT NECK  Findings:  There is right-sided cervical and supraclavicular lymphadenopathy with enhancing lymph nodes demonstrated IIA, IIB, III, IV and V levels.  Cervical lymph nodes measure up to about 1.1 x 2.5 cm diameter.  Supraclavicular lymph nodes measure up to about 0.9 x 1.4 cm.  No significant lymphadenopathy is demonstrated on the left.  Changes could be due to infectious or inflammatory process, lymphoma, or metastasis.  Tonsillar adenoidal tissues are not enlarged.  No mucosal space or prevertebral lesions demonstrated.  Salivary glands appear homogeneous and symmetrical. Carotid and jugular vessels are patent and not effaced.  Laryngeal structures appear intact.  Cervical airway appears patent.  Thyroid gland appears homogeneous.  Normal alignment of the cervical vertebrae.  No destructive bone  lesions appreciated.  IMPRESSION: Fairly extensive lymphadenopathy along the right cervical and supraclavicular regions.  Changes could represent lymphoma, infectious or inflammatory process, or metastasis.  CT CHEST  Findings: Right-sided supraclavicular lymphadenopathy as previously discussed.  See above.  No significant axillary, mediastinal, or hilar lymphadenopathy on either side.  Normal heart size.  Normal caliber thoracic aorta.  No mediastinal mass lesion.  Esophagus is decompressed.  Small calcified lymph node in the middle mediastinum to the right of the right mainstem bronchus.  No pleural effusions. Lungs appear clear.  No focal lung mass, consolidation, airspace disease, or interstitial infiltration.  Airways appear patent.  No pneumothorax.  Normal alignment of the thoracic spine.  No destructive bone lesions are appreciated.  IMPRESSION: Right supraclavicular lymphadenopathy.  Chest is otherwise negative.  CT ABDOMEN AND PELVIS  Findings: Spleen is mildly enlarged, measuring about 5.8 x 10.5 x 13.6 cm.  Normal homogeneous parenchymal echotexture.  The liver, gallbladder, pancreas, adrenal glands, kidneys, abdominal aorta, and inferior vena cava are unremarkable.  Mesenteric and retroperitoneal lymph nodes are present but without pathologic enlargement.  The stomach, small bowel, and colon are decompressed. No free air or free fluid in the abdomen.  Abdominal wall musculature appears intact.  Pelvis:  The prostate gland is not enlarged.  Bladder wall is not thickened.  Stool filled rectosigmoid colon without diverticulitis. The appendix is normal.  No free or loculated pelvic fluid collections.  No significant lymphadenopathy in the pelvis or groin regions.  Visualized growing nodes are not pathologically enlarged. Normal alignment of the lumbar vertebra.  No destructive bone lesions appreciated.  IMPRESSION: Mild splenic enlargement.  No significant lymphadenopathy in the abdomen or pelvis.    Original Report Authenticated By: Burman Nieves, M.D.   US Biopsy  07/14/2012   *RADIOLOGY REPORT*  Clinical Data: Right cervical adenopathy  ULTRASOUND RIGHT CERVICAL ADENOPATHY 18 GAUGE CORE BIOPSY  Date:  07/14/2012 10:55:00  Radiologist:  M. Ruel Favors, M.D.  Medications:  1% lidocaine locally  Guidance:  Ultrasound  Complications:  No immediate  PROCEDURE/FINDINGS:  Informed consent was obtained from the patient following explanation of the procedure, risks, benefits and alternatives. The patient understands, agrees and consents for the procedure. All questions were addressed.  A time out was performed.  Maximal barrier sterile technique utilized including caps, mask, sterile gowns, sterile gloves, large sterile drape, hand hygiene, and betadine  Previous imaging reviewed.  Preliminary ultrasound performed. Right cervical adenopathy demonstrated.  Overlying skin marked. Under sterile conditions and local anesthesia, an 18 gauge core needle was advanced into the right cervical adenopathy.  Several 1 cm 18 gauge core biopsies were obtained of the abnormal adenopathy under direct ultrasound.  These biopsies were placed in saline. Post procedure imaging demonstrates no evidence of hemorrhage or hematoma.  The patient tolerated the biopsy well.  IMPRESSION: Successful right cervical adenopathy 18 gauge core biopsies   Original Report Authenticated By: Judie Petit. Shick, M.D.    ASSESSMENT: 22 y.o. 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 He understands this is not a cancer, but a reactive process, which is generally self-limiting. In fact his right supraclavicular and cervical adenopathy does appear decreased. However his neutropenia has not resolved.  I am asking him to return monthly for  the next 3 months and see me again with the 3 month labwork, by which time I hope things will have cleared and he may be released from followup. I also gave him a way to contact me if he develops high fevers, drenching sweats, or just starts "feeling bad" for any reason.   Lowella Dell, MD   07/27/2012 5:11 PM

## 2012-07-28 ENCOUNTER — Telehealth: Payer: Self-pay | Admitting: *Deleted

## 2012-07-28 NOTE — Telephone Encounter (Signed)
Lm gve appts for  labs 08/23/12 @3 :45pm, 09/27/12 @3 :45pm, and 10/25/12 @4pm  with ov @ 4:30pm. Pt is aware that i will mail a letter/avs as well...td

## 2012-08-10 LAB — FUNGUS CULTURE W SMEAR

## 2012-08-11 ENCOUNTER — Other Ambulatory Visit: Payer: Self-pay | Admitting: Oncology

## 2012-08-22 ENCOUNTER — Other Ambulatory Visit: Payer: Self-pay | Admitting: Physician Assistant

## 2012-08-22 DIAGNOSIS — D709 Neutropenia, unspecified: Secondary | ICD-10-CM

## 2012-08-23 ENCOUNTER — Other Ambulatory Visit: Payer: Managed Care, Other (non HMO)

## 2012-08-25 ENCOUNTER — Telehealth: Payer: Self-pay | Admitting: Emergency Medicine

## 2012-08-25 NOTE — Telephone Encounter (Signed)
Called patient and left a message for patient to call back to schedule a lab only appointment this week. Instructed patient to call this office back and to ask for Dr Magrinat's nurse.

## 2012-08-26 ENCOUNTER — Telehealth: Payer: Self-pay

## 2012-08-26 ENCOUNTER — Telehealth: Payer: Self-pay | Admitting: *Deleted

## 2012-08-26 ENCOUNTER — Ambulatory Visit: Payer: Managed Care, Other (non HMO)

## 2012-08-26 ENCOUNTER — Ambulatory Visit (INDEPENDENT_AMBULATORY_CARE_PROVIDER_SITE_OTHER): Payer: Managed Care, Other (non HMO) | Admitting: Family Medicine

## 2012-08-26 VITALS — BP 98/75 | HR 70 | Temp 98.5°F | Resp 18 | Ht 68.0 in | Wt 124.4 lb

## 2012-08-26 DIAGNOSIS — R05 Cough: Secondary | ICD-10-CM

## 2012-08-26 DIAGNOSIS — R51 Headache: Secondary | ICD-10-CM

## 2012-08-26 DIAGNOSIS — R591 Generalized enlarged lymph nodes: Secondary | ICD-10-CM

## 2012-08-26 DIAGNOSIS — G44209 Tension-type headache, unspecified, not intractable: Secondary | ICD-10-CM

## 2012-08-26 LAB — POCT CBC
HCT, POC: 47.7 % (ref 43.5–53.7)
Lymph, poc: 1.1 (ref 0.6–3.4)
MCH, POC: 28.7 pg (ref 27–31.2)
MCHC: 31.7 g/dL — AB (ref 31.8–35.4)
MCV: 90.5 fL (ref 80–97)
POC LYMPH PERCENT: 56.6 %L — AB (ref 10–50)
RDW, POC: 13.8 %

## 2012-08-26 LAB — AFB CULTURE WITH SMEAR (NOT AT ARMC): Acid Fast Smear: NONE SEEN

## 2012-08-26 MED ORDER — MOXIFLOXACIN HCL 400 MG PO TABS: 400.0000 mg | ORAL_TABLET | Freq: Every day | ORAL | Status: DC

## 2012-08-26 NOTE — Telephone Encounter (Signed)
Dr. Cyndie Chime notified of this call.  Instructed to notify Dr. Dallas Schimke that from a hematology standpoint this is of no urgent care.  This newly identified virus does not have a treatment at this time.  Can refer patient to Dr. Jerolyn Center with the [facility] providers.  Called [facility] on [address] at [phone].  Jasmine given this information to give to Dr. Dallas Schimke after being on hold for ten minutes.

## 2012-08-26 NOTE — Progress Notes (Signed)
[facility] [address] [place] [address] [phone]  Date:  08/26/2012   Name:  Filomeno Cromley   DOB:  1990-06-11   MRN:  [id number]  PCP:  No PCP Per Patient    Chief Complaint: Cough and Headache   History of Present Illness:  Calvin Wolf is a 22 y.o. 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 He is here today with illness.  A couple of days ago he noted a headache.  Also cough for 2 or 3 days.    He has not noted a fever this time No aches or chills.  He does feel tired No GI symptoms.   He is seeing oncology now monthly for labs.  He missed his ID appt this week but will reschedule.   So far he has not taken any medicattion except for something OTC for HA- "a couple of weeks ago."  No antipyretics recently.   He is back on a normal eating schedule now that Ramadan is over, but has not yet gotten back to soccer because he is very busy with school.    Patient Active Problem List   Diagnosis Date Noted  . 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 Neutropenic fever 07/11/2012  . Lump in neck 07/11/2012  . Immigrant with language difficulty 07/11/2012    Past Medical History  Diagnosis Date  . Anemia age 61    Pt states "I had before anemia"    Past Surgical History  Procedure Laterality Date  . No past surgeries      History  Substance Use Topics  . Smoking status: Current Every Day Smoker -- 1.00 packs/day for .5 years  . Smokeless tobacco: Never Used  . Alcohol Use: Yes     Comment: "a little bit"  last time was in June 2014    Family History  Problem Relation Age of Onset  . Diabetes Father     No Known Allergies  Medication list has been reviewed and updated.  Current Outpatient Prescriptions on File Prior to Visit  Medication Sig Dispense Refill  . feeding supplement (ENSURE  COMPLETE) LIQD Take 237 mLs by mouth 2 (two) times daily between meals.  30 Bottle  0  . oxyCODONE-acetaminophen (PERCOCET/ROXICET) 5-325 MG per tablet Take 1 tablet by mouth every 4 (four) hours as needed.  30 tablet  0   No current facility-administered medications on file prior to visit.    Review of Systems:  As per HPI- otherwise negative.   Physical Examination: Filed Vitals:   08/26/12 1455  BP: 92/50  Pulse: 103  Temp: 98.5 F (36.9 C)  Resp: 18   Filed Vitals:   08/26/12 1455  Height: 5\' 8"  (1.727 m)  Weight: 124 lb 6.4 oz (56.427 kg)   Body mass index is 18.92 kg/(m^2). Ideal Body Weight: Weight in (lb) to have BMI = 25: 164.1  GEN: WDWN, NAD, Non-toxic, A & O x 3, looks well HEENT: Atraumatic, Normocephalic. Neck supple. No masses, now with minimal cervical LAD.  Bilateral TM wnl, oropharynx normal.  PEERL,EOMI.   Ears and Nose: No external deformity. CV: RRR, No M/G/R. No JVD. No thrill. No extra heart sounds. PULM: CTA B, no wheezes, crackles, rhonchi. No retractions. No resp. distress. No accessory muscle use. ABD: S, NT, ND, +BS. No rebound. No HSM.  EXTR: No c/c/e NEURO Normal gait.  PSYCH: Normally interactive. Conversant. Not depressed or anxious appearing.  Calm demeanor.   [facility] reading (PRIMARY) by  Dr. Patsy Lager. CXR:  Negative.    CHEST - 2 VIEW  Comparison: None.  Findings: Cardiomediastinal silhouette appears normal. No acute pulmonary disease is noted. Bony thorax is intact.  IMPRESSION: No acute cardiopulmonary abnormality seen.  Clinically significant discrepancy from primary report, if provided: None   Results for orders placed in visit on 08/26/12  POCT CBC      Result Value Range   WBC 2.0 (*) 4.6 - 10.2 K/uL   Lymph, poc 1.1  0.6 - 3.4   POC LYMPH PERCENT 56.6 (*) 10 - 50 %L   MID (cbc) 0.2  0 - 0.9   POC MID % 12.5 (*) 0 - 12 %M   POC Granulocyte 0.6 (*) 2 - 6.9   Granulocyte percent 30.9 (*) 37 - 80 %G   RBC 5.27  4.69 -  6.13 M/uL   Hemoglobin 15.1  14.1 - 18.1 g/dL   HCT, POC 16.1  09.6 - 53.7 %   MCV 90.5  80 - 97 fL   MCH, POC 28.7  27 - 31.2 pg   MCHC 31.7 (*) 31.8 - 35.4 g/dL   RDW, POC 04.5     Platelet Count, POC 222  142 - 424 K/uL   MPV 9.9  0 - 99.8 fL   Assessment and Plan: Cough - Plan: DG Chest 2 View, POCT CBC, moxifloxacin (AVELOX) 400 MG tablet  Headache(784.0) - Plan: DG Chest 2 View, POCT CBC  22 year old male with recent dx of Kikuchi's disease. At this time he is afebrile, but continues to be neutropenic. Will cover with abx- avelox.  Close follow-up.  He will call ID on Monday and reschedule his appt.    Signed Abbe Amsterdam, MD

## 2012-08-26 NOTE — Telephone Encounter (Signed)
Dr. Patsy Lager already spoke to someone at the [facility] regarding this patient, but person on the phone was adamant about leaving the message anyway.  She states "hematologists says this is not an urgent matter. It is a newly identified virus which has no treatment. Dr. Cyndie Chime recommends referring to Dr. Jerolyn Center who is an infections disease specialist."

## 2012-08-26 NOTE — Patient Instructions (Addendum)
Use the avelox once a day to fight infection.  Please be sure to call the infectious disease clinic on Monday to schedule a follow-up appointment.  If you are feeling worse or have any other concerns don't hesitate to come back in or call.

## 2012-08-26 NOTE — Telephone Encounter (Addendum)
Patient in with Dr. Dallas Schimke at this time.  She asked if his counts are low enough to worry about.  Celdine used reads granulocytes = 0.6.  Patient has Kikuchi disease.  Dr. Durel Salts cell number is [phone].

## 2012-08-29 NOTE — Telephone Encounter (Signed)
Referral was to oncology, do you want to change to infectious disease? pended

## 2012-08-30 NOTE — Telephone Encounter (Signed)
Called and LMOM.  As per our conversation he already planned to see ID and had a referral already.  If this is not the case please let us know.

## 2012-08-31 ENCOUNTER — Telehealth: Payer: Self-pay

## 2012-08-31 NOTE — Telephone Encounter (Signed)
Patient was not seen.

## 2012-08-31 NOTE — Telephone Encounter (Signed)
Patient needs a note for school for August 6th. [phone]

## 2012-09-01 NOTE — Telephone Encounter (Signed)
Have called him a few times but so far no answer and his VM is not set up Need to clarify this request- ? Is this even the correct date?  Why does he need this note?

## 2012-09-02 NOTE — Telephone Encounter (Signed)
Called again, still no answer and no VM.    Will have to wait for him to call back to clarify prior to writing note

## 2012-09-06 ENCOUNTER — Ambulatory Visit (INDEPENDENT_AMBULATORY_CARE_PROVIDER_SITE_OTHER): Payer: Managed Care, Other (non HMO) | Admitting: Emergency Medicine

## 2012-09-06 VITALS — BP 98/62 | HR 86 | Temp 98.0°F | Resp 16 | Ht 68.0 in | Wt 126.0 lb

## 2012-09-06 DIAGNOSIS — K08409 Partial loss of teeth, unspecified cause, unspecified class: Secondary | ICD-10-CM

## 2012-09-06 DIAGNOSIS — R109 Unspecified abdominal pain: Secondary | ICD-10-CM

## 2012-09-06 DIAGNOSIS — K2970 Gastritis, unspecified, without bleeding: Secondary | ICD-10-CM

## 2012-09-06 LAB — POCT URINALYSIS DIPSTICK
Glucose, UA: NEGATIVE
Ketones, UA: NEGATIVE
Leukocytes, UA: NEGATIVE
Spec Grav, UA: >=1.03
Urobilinogen, UA: 0.2

## 2012-09-06 LAB — COMPREHENSIVE METABOLIC PANEL
AST: 25 U/L (ref 0–37)
Alkaline Phosphatase: 73 U/L (ref 39–117)
Calcium: 9.5 mg/dL (ref 8.4–10.5)
Glucose, Bld: 99 mg/dL (ref 70–99)
Potassium: 4.3 mEq/L (ref 3.5–5.3)
Sodium: 139 mEq/L (ref 135–145)

## 2012-09-06 LAB — POCT CBC
Granulocyte percent: 37.1 %G (ref 37–80)
MID (cbc): 0.3 (ref 0–0.9)
MPV: 9.9 fL (ref 0–99.8)
POC MID %: 14 %M — AB (ref 0–12)
Platelet Count, POC: 193 10*3/uL (ref 142–424)
RBC: 5.01 M/uL (ref 4.69–6.13)

## 2012-09-06 LAB — POCT UA - MICROSCOPIC ONLY: Casts, Ur, LPF, POC: NEGATIVE

## 2012-09-06 MED ORDER — ESOMEPRAZOLE MAGNESIUM 40 MG PO CPDR: 40.0000 mg | DELAYED_RELEASE_CAPSULE | Freq: Every day | ORAL | Status: DC

## 2012-09-06 NOTE — Progress Notes (Signed)
[facility] [address] [place] [address] [phone]  Date:  09/06/2012   Name:  Calvin Wolf   DOB:  23-Feb-1990   MRN:  [id number]  PCP:  No PCP Per Patient    Chief Complaint: Abdominal Pain   History of Present Illness:  Calvin Wolf is a 22 y.o. very pleasant male patient who presents with the following:  Has abdominal pain associated with eating started yesterday.  Has not eaten for fear of pain.  No nausea or vomiting.  No stool change. No icterus.  No fever or chills.  Drinks a lot of caffeine.  Smokes.  No alcohol. Moderate NSAID.  In school.  No blood in stool or black stool.  No improvement with over the counter medications or other home remedies. Denies other complaint or health concern today.   Patient Active Problem List   Diagnosis Date Noted  . 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 Neutropenic fever 07/11/2012  . Lump in neck 07/11/2012  . Immigrant with language difficulty 07/11/2012    Past Medical History  Diagnosis Date  . Anemia age 20    Pt states "I had before anemia"    Past Surgical History  Procedure Laterality Date  . No past surgeries      History  Substance Use Topics  . Smoking status: Current Every Day Smoker -- 1.00 packs/day for .5 years  . Smokeless tobacco: Never Used  . Alcohol Use: Yes     Comment: "a little bit"  last time was in June 2014    Family History  Problem Relation Age of Onset  . Diabetes Father     No Known Allergies  Medication list has been reviewed and updated.  Current Outpatient Prescriptions on File Prior to Visit  Medication Sig Dispense Refill  . feeding supplement (ENSURE COMPLETE) LIQD Take 237 mLs by mouth 2 (two) times daily between meals.  30 Bottle  0  . moxifloxacin (AVELOX) 400 MG tablet Take 1 tablet (400 mg total) by mouth daily.  7 tablet  0  . oxyCODONE-acetaminophen (PERCOCET/ROXICET) 5-325 MG per tablet Take 1 tablet by mouth every 4 (four) hours as needed.  30  tablet  0   No current facility-administered medications on file prior to visit.    Review of Systems:  As per HPI, otherwise negative.    Physical Examination: Filed Vitals:   09/06/12 1117  BP: 98/62  Pulse: 86  Temp: 98 F (36.7 C)  Resp: 16   Filed Vitals:   09/06/12 1117  Height: 5\' 8"  (1.727 m)  Weight: 126 lb (57.153 kg)   Body mass index is 19.16 kg/(m^2). Ideal Body Weight: Weight in (lb) to have BMI = 25: 164.1  GEN: WDWN, NAD, Non-toxic, A & O x 3 HEENT: Atraumatic, Normocephalic. Neck supple. No masses, No LAD. Ears and Nose: No external deformity. CV: RRR, No M/G/R. No JVD. No thrill. No extra heart sounds. PULM: CTA B, no wheezes, crackles, rhonchi. No retractions. No resp. distress. No accessory muscle use. ABD: S, tender epigastrium, ND, +BS. No rebound. No HSM. EXTR: No c/c/e NEURO Normal gait.  PSYCH: Normally interactive. Conversant. Not depressed or anxious appearing.  Calm demeanor.    Assessment and Plan: Abdominal pain Gastritis nexium Follow up based on labs Follow up with oncology by phone today!  Signed,  Phillips Odor, MD   Results for orders placed in visit on 09/06/12  POCT CBC      Result Value  Range   WBC 2.1 (*) 4.6 - 10.2 K/uL   Lymph, poc 1.0  0.6 - 3.4   POC LYMPH PERCENT 48.9  10 - 50 %L   MID (cbc) 0.3  0 - 0.9   POC MID % 14.0 (*) 0 - 12 %M   POC Granulocyte 0.8 (*) 2 - 6.9   Granulocyte percent 37.1  37 - 80 %G   RBC 5.01  4.69 - 6.13 M/uL   Hemoglobin 14.6  14.1 - 18.1 g/dL   HCT, POC 09.8  11.9 - 53.7 %   MCV 89.4  80 - 97 fL   MCH, POC 29.1  27 - 31.2 pg   MCHC 32.6  31.8 - 35.4 g/dL   RDW, POC 14.7     Platelet Count, POC 193  142 - 424 K/uL   MPV 9.9  0 - 99.8 fL  POCT UA - MICROSCOPIC ONLY      Result Value Range   WBC, Ur, HPF, POC 0-1     RBC, urine, microscopic 0-1     Bacteria, U Microscopic trace     Mucus, UA neg     Epithelial cells, urine per micros neg     Crystals, Ur, HPF, POC neg      Casts, Ur, LPF, POC neg     Yeast, UA neg    POCT URINALYSIS DIPSTICK      Result Value Range   Color, UA dark yellow     Clarity, UA clear     Glucose, UA neg     Bilirubin, UA neg     Ketones, UA neg     Spec Grav, UA >=1.030     Blood, UA neg     pH, UA 6.0     Protein, UA neg     Urobilinogen, UA 0.2     Nitrite, UA neg     Leukocytes, UA Negative

## 2012-09-06 NOTE — Patient Instructions (Addendum)

## 2012-09-09 ENCOUNTER — Other Ambulatory Visit: Payer: Self-pay | Admitting: Oncology

## 2012-09-09 MED ORDER — AMOXICILL-CLARITHRO-LANSOPRAZ PO MISC: Freq: Two times a day (BID) | ORAL | Status: DC

## 2012-09-09 NOTE — Addendum Note (Signed)
Addended by: Carmelina Dane on: 09/09/2012 09:35 AM   Modules accepted: Orders

## 2012-09-15 ENCOUNTER — Telehealth: Payer: Self-pay | Admitting: Oncology

## 2012-09-15 ENCOUNTER — Other Ambulatory Visit: Payer: Self-pay | Admitting: Oncology

## 2012-09-27 ENCOUNTER — Ambulatory Visit: Payer: Managed Care, Other (non HMO) | Admitting: Oncology

## 2012-09-27 ENCOUNTER — Other Ambulatory Visit: Payer: Managed Care, Other (non HMO) | Admitting: Lab

## 2012-09-29 ENCOUNTER — Telehealth: Payer: Self-pay | Admitting: Oncology

## 2012-09-29 NOTE — Telephone Encounter (Signed)
Sent letter to patient from Dr. Magrinat. °

## 2012-10-25 ENCOUNTER — Other Ambulatory Visit: Payer: Managed Care, Other (non HMO)

## 2012-10-25 ENCOUNTER — Ambulatory Visit: Payer: Managed Care, Other (non HMO) | Admitting: Oncology

## 2012-10-26 ENCOUNTER — Telehealth: Payer: Self-pay | Admitting: Physician Assistant

## 2012-10-26 NOTE — Telephone Encounter (Signed)
Sent letter to patient from Amy Berry PA-C °

## 2013-09-04 ENCOUNTER — Ambulatory Visit (INDEPENDENT_AMBULATORY_CARE_PROVIDER_SITE_OTHER): Payer: Managed Care, Other (non HMO) | Admitting: Physician Assistant

## 2013-09-04 VITALS — BP 110/58 | HR 98 | Temp 98.0°F | Resp 16 | Ht 68.0 in | Wt 136.2 lb

## 2013-09-04 DIAGNOSIS — Z111 Encounter for screening for respiratory tuberculosis: Secondary | ICD-10-CM

## 2013-09-04 NOTE — Progress Notes (Signed)
  Tuberculosis Risk Questionnaire  1. Yes [place] Were you born outside the [place] in one of the following parts of the world: [place], [place], [place], [place] or [place]?    2. Yes [place] Have you traveled outside the [place] and lived for more than one month in one of the following parts of the world: [place], [place], [place], [place] or [place]?    3. No Do you have a compromised immune system such as from any of the following conditions:HIV/AIDS, organ or bone marrow transplantation, diabetes, immunosuppressive medicines (e.g. Prednisone, Remicaide), leukemia, lymphoma, cancer of the head or neck, gastrectomy or jejunal bypass, end-stage renal disease (on dialysis), or silicosis?     4. No Have you ever or do you plan on working in: a residential care center, a health care facility, a jail or prison or homeless shelter?    5. No Have you ever: injected illegal drugs, used crack cocaine, lived in a homeless shelter  or been in jail or prison?     6. No Have you ever been exposed to anyone with infectious tuberculosis?    Tuberculosis Symptom Questionnaire  Do you currently have any of the following symptoms?  1. No Unexplained cough lasting more than 3 weeks?   2. No Unexplained fever lasting more than 3 weeks.   3. No Night Sweats (sweating that leaves the bedclothes and sheets wet)     4. No Shortness of Breath   5. No Chest Pain   6. No Unintentional weight loss    7. No Unexplained fatigue (very tired for no reason)

## 2013-09-06 ENCOUNTER — Ambulatory Visit (INDEPENDENT_AMBULATORY_CARE_PROVIDER_SITE_OTHER): Payer: Managed Care, Other (non HMO)

## 2013-09-06 DIAGNOSIS — Z111 Encounter for screening for respiratory tuberculosis: Secondary | ICD-10-CM

## 2013-09-06 DIAGNOSIS — Z09 Encounter for follow-up examination after completed treatment for conditions other than malignant neoplasm: Secondary | ICD-10-CM

## 2013-09-06 LAB — TB SKIN TEST
INDURATION: 0 mm
TB Skin Test: NEGATIVE

## 2013-09-06 NOTE — Progress Notes (Signed)
   Subjective:    Patient ID: Calvin Wolf, male    DOB: 04/21/1990, 23 y.o.   MRN: [id number]  HPI Pt here for PPD read only.   Review of Systems     Objective:   Physical Exam        Assessment & Plan:  PPD negative 0mm induration

## 2013-09-25 ENCOUNTER — Ambulatory Visit (INDEPENDENT_AMBULATORY_CARE_PROVIDER_SITE_OTHER): Payer: Managed Care, Other (non HMO) | Admitting: Internal Medicine

## 2013-09-25 VITALS — BP 120/80 | HR 92 | Temp 97.9°F | Resp 16 | Ht 68.5 in | Wt 137.0 lb

## 2013-09-25 DIAGNOSIS — R509 Fever, unspecified: Secondary | ICD-10-CM

## 2013-09-25 DIAGNOSIS — Z13220 Encounter for screening for lipoid disorders: Secondary | ICD-10-CM

## 2013-09-25 DIAGNOSIS — D72819 Decreased white blood cell count, unspecified: Secondary | ICD-10-CM

## 2013-09-25 LAB — COMPLETE METABOLIC PANEL WITH GFR
ALBUMIN: 4.9 g/dL (ref 3.5–5.2)
ALK PHOS: 62 U/L (ref 39–117)
ALT: 41 U/L (ref 0–53)
AST: 25 U/L (ref 0–37)
BUN: 10 mg/dL (ref 6–23)
CALCIUM: 10.2 mg/dL (ref 8.4–10.5)
CHLORIDE: 102 meq/L (ref 96–112)
CO2: 29 mEq/L (ref 19–32)
Creat: 0.87 mg/dL (ref 0.50–1.35)
GFR, Est African American: >89 mL/min
GFR, Est Non African American: >89 mL/min
Glucose, Bld: 80 mg/dL (ref 70–99)
POTASSIUM: 4.5 meq/L (ref 3.5–5.3)
SODIUM: 138 meq/L (ref 135–145)
Total Bilirubin: 0.7 mg/dL (ref 0.2–1.2)
Total Protein: 7.5 g/dL (ref 6.0–8.3)

## 2013-09-25 LAB — POCT CBC
GRANULOCYTE PERCENT: 46.8 % (ref 37–80)
HCT, POC: 49.6 % (ref 43.5–53.7)
Hemoglobin: 16.2 g/dL (ref 14.1–18.1)
Lymph, poc: 1.2 (ref 0.6–3.4)
MCH, POC: 28.6 pg (ref 27–31.2)
MCHC: 32.7 g/dL (ref 31.8–35.4)
MCV: 87.4 fL (ref 80–97)
MID (CBC): 0.3 (ref 0–0.9)
MPV: 8.1 fL (ref 0–99.8)
PLATELET COUNT, POC: 209 10*3/uL (ref 142–424)
POC GRANULOCYTE: 1.3 — AB (ref 2–6.9)
POC LYMPH %: 43.5 % (ref 10–50)
POC MID %: 9.7 % (ref 0–12)
RBC: 5.67 M/uL (ref 4.69–6.13)
RDW, POC: 13.7 %
WBC: 2.8 10*3/uL — AB (ref 4.6–10.2)

## 2013-09-25 LAB — LIPID PANEL
CHOL/HDL RATIO: 3 ratio
CHOLESTEROL: 118 mg/dL (ref 0–200)
HDL: 39 mg/dL — ABNORMAL LOW (ref >39)
LDL CALC: 59 mg/dL (ref 0–99)
Triglycerides: 98 mg/dL (ref <150)
VLDL: 20 mg/dL (ref 0–40)

## 2013-09-25 NOTE — Progress Notes (Signed)
   Subjective:    Patient ID: Calvin Wolf, male    DOB: 07-12-1990, 23 y.o.   MRN: [id number]  HPI  23 y.o. Male presents to clinic today with fever and congestion 2 days. Reports that he has had fatigue and body aches's. Denies any sore throat, cough, diarrhea, or abdominal pain, nausea or vomiting. Denies any rashes or any tick/insect bites.   Notices himself having frequent headaches. This has gone on for the past 10-12 years. He has headaches that last for an hour every Wolf and goes away without treatment.  He was discussed other health issues to see if he needs a physical examination-see review of systems  Reports that he did at one time drink coffee, and is a current smoker, although he is cutting down significantly.   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  HENT: Negative for dental problem and trouble swallowing.   Eyes: Negative for photophobia and visual disturbance.  Respiratory: Negative for cough, chest tightness and wheezing.   Cardiovascular: Negative for chest pain, palpitations and leg swelling.  Gastrointestinal: Negative for abdominal pain, diarrhea and constipation.  Genitourinary: Negative for difficulty urinating.  Musculoskeletal: Negative for back pain and joint swelling.  Skin: Negative for rash.  Neurological: Negative for dizziness, syncope and weakness.  Hematological: Negative for adenopathy. Does not bruise/bleed easily.  Psychiatric/Behavioral: Negative for behavioral problems, sleep disturbance and dysphoric mood.       Objective:   Physical Exam BP 120/80  Pulse 92  Temp(Src) 97.9 F (36.6 C) (Oral)  Resp 16  Ht 5' 8.5" (1.74 m)  Wt 137 lb (62.143 kg)  BMI 20.53 kg/m2  SpO2 99% HEENT clear with clear rhinorrhea Heart regular without murmur  Lungs clear Abdomen benign  without hepatosplenomegaly (even though splenomegaly noted CAT scan last year) Extremities clear Skin clear Neuro intact Mood good/affect appropriate       Assessment & Plan:  Fever, unspecified fever cause  Viral illness likely-OTC treatment if needed  Screening for hyperlipidemia - Plan: POCT CBC, COMPLETE METABOLIC PANEL WITH GFR, Lipid panel  Leucopenia - Plan: POCT CBC, COMPLETE METABOLIC PANEL WITH GFR, Lipid panel  he would like have some labs for reassurance///he has had chronic low white blood cell count He is not at risk for STDs Had recent TB screening negative

## 2013-09-29 ENCOUNTER — Encounter: Payer: Self-pay | Admitting: Internal Medicine

## 2013-10-09 ENCOUNTER — Ambulatory Visit (INDEPENDENT_AMBULATORY_CARE_PROVIDER_SITE_OTHER): Payer: Managed Care, Other (non HMO)

## 2013-10-09 ENCOUNTER — Ambulatory Visit (INDEPENDENT_AMBULATORY_CARE_PROVIDER_SITE_OTHER): Payer: Managed Care, Other (non HMO) | Admitting: Family Medicine

## 2013-10-09 VITALS — BP 110/68 | HR 83 | Temp 97.4°F | Resp 18 | Ht 68.0 in | Wt 135.0 lb

## 2013-10-09 DIAGNOSIS — R112 Nausea with vomiting, unspecified: Secondary | ICD-10-CM

## 2013-10-09 DIAGNOSIS — R197 Diarrhea, unspecified: Secondary | ICD-10-CM

## 2013-10-09 DIAGNOSIS — D709 Neutropenia, unspecified: Secondary | ICD-10-CM

## 2013-10-09 LAB — POCT CBC
GRANULOCYTE PERCENT: 40.8 % (ref 37–80)
HEMATOCRIT: 51 % (ref 43.5–53.7)
HEMOGLOBIN: 16.8 g/dL (ref 14.1–18.1)
LYMPH, POC: 1.6 (ref 0.6–3.4)
MCH, POC: 28.8 pg (ref 27–31.2)
MCHC: 33 g/dL (ref 31.8–35.4)
MCV: 87.5 fL (ref 80–97)
MID (cbc): 0.3 (ref 0–0.9)
MPV: 7.1 fL (ref 0–99.8)
POC GRANULOCYTE: 1.3 — AB (ref 2–6.9)
POC LYMPH %: 50 % (ref 10–50)
POC MID %: 9.2 %M (ref 0–12)
Platelet Count, POC: 241 10*3/uL (ref 142–424)
RBC: 5.82 M/uL (ref 4.69–6.13)
RDW, POC: 14 %
WBC: 3.2 10*3/uL — AB (ref 4.6–10.2)

## 2013-10-09 MED ORDER — ONDANSETRON 4 MG PO TBDP
8.0000 mg | ORAL_TABLET | Freq: Once | ORAL | Status: AC
  Administered 2013-10-09: 8 mg via ORAL

## 2013-10-09 MED ORDER — ONDANSETRON HCL 8 MG PO TABS: 8.0000 mg | ORAL_TABLET | Freq: Three times a day (TID) | ORAL | Status: DC | PRN

## 2013-10-09 NOTE — Progress Notes (Addendum)
[facility] [address] [place] [address] [phone]  Date:  10/09/2013   Name:  Calvin Wolf   DOB:  04-23-1990   MRN:  [id number]  PCP:  No PCP Per Patient    Chief Complaint: Headache, Emesis and Abdominal Pain   History of Present Illness:  Calvin Wolf is a 23 y.o. very pleasant male patient who presents with the following:  He is here today with abdominal pain and vomiting, headache. GI sx started yesterday, HA for 2 days.  He has vomited twice, and has had some diarrhea.  He is worried because he has an exam tomorrow and needs to be able to study.  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   He has not noted a fever.  No dysuria.   Patient Active Problem List   Diagnosis Date Noted  . 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 Neutropenic fever 07/11/2012  . Lump in neck 07/11/2012  . Immigrant with language difficulty 07/11/2012    Past Medical History  Diagnosis Date  . Anemia age 47    Pt states "I had before anemia"    Past Surgical History  Procedure Laterality Date  . No past surgeries      History  Substance Use Topics  . Smoking status: Current Every Day Smoker -- 1.00 packs/day for .5 years  . Smokeless tobacco: Never Used  . Alcohol Use: Yes     Comment: "a little bit"  last time was in June 2014    Family History  Problem Relation Age of Onset  . Diabetes Father     No Known Allergies  Medication list has been reviewed and updated.  Current Outpatient Prescriptions on File Prior to Visit  Medication Sig Dispense Refill  . esomeprazole (NEXIUM) 40 MG capsule Take 1 capsule (40 mg total) by mouth daily.  30 capsule  3  . feeding supplement (ENSURE COMPLETE) LIQD Take 237 mLs by mouth 2 (two) times daily between meals.  30 Bottle  0  . oxyCODONE-acetaminophen  (PERCOCET/ROXICET) 5-325 MG per tablet Take 1 tablet by mouth every 4 (four) hours as needed.  30 tablet  0   No current facility-administered medications on file prior to visit.    Review of Systems:  As per HPI- otherwise negative.   Physical Examination: Filed Vitals:   10/09/13 1527  BP: 110/68  Pulse: 83  Temp: 97.4 F (36.3 C)  Resp: 18   Filed Vitals:   10/09/13 1527  Height: 5\' 8"  (1.727 m)  Weight: 135 lb (61.236 kg)   Body mass index is 20.53 kg/(m^2). Ideal Body Weight: Weight in (lb) to have BMI = 25: 164.1  GEN: WDWN, NAD, Non-toxic, A & O x 3, looks well HEENT: Atraumatic, Normocephalic. Neck supple. No masses, No LAD.  Bilateral TM wnl, oropharynx normal.  PEERL,EOMI.   Ears and Nose: No external deformity. CV: RRR, No M/G/R. No JVD. No thrill. No extra heart sounds. PULM: CTA B, no wheezes, crackles, rhonchi. No retractions. No resp. distress. No accessory muscle use. ABD: S,  ND, +BS. No rebound. No HSM.  He has minimal tenderness over the mid lower abdomen EXTR: No c/c/e NEURO Normal gait.  PSYCH: Normally interactive. Conversant. Not depressed or anxious appearing.  Calm demeanor.   [facility] reading (PRIMARY)  by  Dr. Patsy Lageropland. abd series: negative  EXAM: ABDOMEN - 2 VIEW  COMPARISON: None.  FINDINGS: Lung bases are clear. The bowel gas pattern is unremarkable. There is no evidence for obstruction or free air. The axial skeleton is within normal limits.  IMPRESSION: Negative two view abdomen.  Results for orders placed in visit on 10/09/13  POCT CBC      Result Value Ref Range   WBC 3.2 (*) 4.6 - 10.2 K/uL   Lymph, poc 1.6  0.6 - 3.4   POC LYMPH PERCENT 50.0  10 - 50 %L   MID (cbc) 0.3  0 - 0.9   POC MID % 9.2  0 - 12 %M   POC Granulocyte 1.3 (*) 2 - 6.9   Granulocyte percent 40.8  37 - 80 %G   RBC 5.82  4.69 - 6.13 M/uL   Hemoglobin 16.8  14.1 - 18.1 g/dL   HCT, POC 16.151.0  09.643.5 - 53.7 %   MCV 87.5  80 - 97 fL   MCH, POC 28.8  27 - 31.2  pg   MCHC 33.0  31.8 - 35.4 g/dL   RDW, POC 04.514.0     Platelet Count, POC 241.0  142 - 424 K/uL   MPV 7.1  0 - 99.8 fL   Given zofran 8mg  po once in clinic and he felt better Assessment and Plan: Non-intractable vomiting with nausea, vomiting of unspecified type - Plan: ondansetron (ZOFRAN-ODT) disintegrating tablet 8 mg, DG Abd 2 Views, ondansetron (ZOFRAN) 8 MG tablet  Neutropenia - Plan: POCT CBC  Diarrhea - Plan: Comprehensive metabolic panel  Likely viral gastroenteritis.  However discussed less likely possibility of more serious etiology such as appendicitis.  At this time he declines a CT scan but he will seek care if he does not continue to feel better.  Given zofran rx  Signed Abbe AmsterdamJessica Copland, MD  Called 10/6; he is feeling better.

## 2013-10-09 NOTE — Patient Instructions (Signed)
Use the zofran as needed.  Let us know if you do not feel better in the next day or so.  If you start to get worse seek care right away.   Otherwise I will be in touch with the rest of your labs.  If you have any severe pain go to the emergency room

## 2013-10-10 ENCOUNTER — Encounter: Payer: Self-pay | Admitting: Family Medicine

## 2013-10-10 LAB — COMPREHENSIVE METABOLIC PANEL
ALBUMIN: 4.7 g/dL (ref 3.5–5.2)
ALT: 49 U/L (ref 0–53)
AST: 29 U/L (ref 0–37)
Alkaline Phosphatase: 56 U/L (ref 39–117)
BUN: 10 mg/dL (ref 6–23)
CO2: 29 meq/L (ref 19–32)
Calcium: 9.7 mg/dL (ref 8.4–10.5)
Chloride: 101 mEq/L (ref 96–112)
Creat: 0.95 mg/dL (ref 0.50–1.35)
GLUCOSE: 80 mg/dL (ref 70–99)
POTASSIUM: 4.3 meq/L (ref 3.5–5.3)
SODIUM: 137 meq/L (ref 135–145)
TOTAL PROTEIN: 7.4 g/dL (ref 6.0–8.3)
Total Bilirubin: 0.9 mg/dL (ref 0.2–1.2)

## 2014-03-02 ENCOUNTER — Ambulatory Visit (INDEPENDENT_AMBULATORY_CARE_PROVIDER_SITE_OTHER): Payer: PRIVATE HEALTH INSURANCE | Admitting: Emergency Medicine

## 2014-03-02 VITALS — BP 94/64 | HR 81 | Temp 97.8°F | Resp 16 | Ht 68.0 in | Wt 135.0 lb

## 2014-03-02 DIAGNOSIS — D72819 Decreased white blood cell count, unspecified: Secondary | ICD-10-CM

## 2014-03-02 DIAGNOSIS — R5383 Other fatigue: Secondary | ICD-10-CM

## 2014-03-02 DIAGNOSIS — G44209 Tension-type headache, unspecified, not intractable: Secondary | ICD-10-CM

## 2014-03-02 MED ORDER — BUTALBITAL-APAP-CAFFEINE 50-325-40 MG PO TABS: 1.0000 | ORAL_TABLET | Freq: Four times a day (QID) | ORAL | Status: DC | PRN

## 2014-03-02 NOTE — Progress Notes (Signed)
[facility] [address], [place] [id number] [phone]  Date:  03/02/2014   Name:  Calvin Bloodbdullah Endo   DOB:  01-17-1990   MRN:  [id number]  PCP:  No PCP Per Patient    Chief Complaint: Headache and Fatigue   History of Present Illness:  Calvin Wolf is a 24 y.o. very pleasant male patient who presents with the following:  Comes to office with concern of headache.  Says is frontal in nature and constant over past two weeks. No neuro or visual symptoms.  Headache on arising and leaves in afternoon.  No photophobia. No nausea or vomiting. Appetite is less.  Says he is eating only lunch and supper.  Only has coffee and cigarettes for breakfast. No nausea or vomiting. No stool change. No weight loss over past 18 months.  Patient Active Problem List   Diagnosis Date Noted  . 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 Neutropenic fever 07/11/2012  . Lump in neck 07/11/2012  . Immigrant with language difficulty 07/11/2012    Past Medical History  Diagnosis Date  . Anemia age 24    Pt states "I had before anemia"    Past Surgical History  Procedure Laterality Date  . No past surgeries      History  Substance Use Topics  . Smoking status: Current Every Day Smoker -- 1.00 packs/day for .5 years  . Smokeless tobacco: Never Used  . Alcohol Use: Yes     Comment: "a little bit"  last time was in June 2014    Family History  Problem Relation Age of Onset  . Diabetes Father     No Known Allergies  Medication list has been reviewed and updated.  Current Outpatient Prescriptions on File Prior to Visit  Medication Sig Dispense Refill  . esomeprazole (NEXIUM) 40 MG capsule Take 1 capsule (40 mg total) by mouth daily. (Patient not taking: Reported on 03/02/2014) 30 capsule 3  . feeding supplement (ENSURE COMPLETE) LIQD Take 237 mLs by mouth 2 (two) times daily between meals. (Patient not taking: Reported on 03/02/2014) 30 Bottle 0  . ondansetron (ZOFRAN) 8 MG  tablet Take 1 tablet (8 mg total) by mouth every 8 (eight) hours as needed for nausea or vomiting. (Patient not taking: Reported on 03/02/2014) 15 tablet 0   No current facility-administered medications on file prior to visit.    Review of Systems:  As per HPI, otherwise negative.    Physical Examination: Filed Vitals:   03/02/14 1506  BP: 94/64  Pulse: 81  Temp: 97.8 F (36.6 C)  Resp: 16   Filed Vitals:   03/02/14 1506  Height: 5\' 8"  (1.727 m)  Weight: 135 lb (61.236 kg)   Body mass index is 20.53 kg/(m^2). Ideal Body Weight: Weight in (lb) to have BMI = 25: 164.1  GEN: WDWN, NAD, Non-toxic, A & O x 3 HEENT: Atraumatic, Normocephalic. Neck supple. No masses, No LAD. Ears and Nose: No external deformity. CV: RRR, No M/G/R. No JVD. No thrill. No extra heart sounds. PULM: CTA B, no wheezes, crackles, rhonchi. No retractions. No resp. distress. No accessory muscle use. ABD: S, NT, ND, +BS. No rebound. No HSM. EXTR: No c/c/e NEURO Normal gait.  PSYCH: Normally interactive. Conversant. Not depressed or anxious appearing.  Calm demeanor.    Assessment and Plan: Headache likely tension Leukopenia Fatigue  Signed,  Phillips OdorJeffery Lyn Joens, MD

## 2014-03-02 NOTE — Patient Instructions (Signed)

## 2014-09-14 ENCOUNTER — Ambulatory Visit (INDEPENDENT_AMBULATORY_CARE_PROVIDER_SITE_OTHER): Payer: PRIVATE HEALTH INSURANCE | Admitting: Family Medicine

## 2014-09-14 VITALS — BP 104/60 | HR 70 | Temp 98.4°F | Resp 16 | Ht 68.0 in | Wt 132.0 lb

## 2014-09-14 DIAGNOSIS — R112 Nausea with vomiting, unspecified: Secondary | ICD-10-CM

## 2014-09-14 DIAGNOSIS — R110 Nausea: Secondary | ICD-10-CM | POA: Diagnosis not present

## 2014-09-14 DIAGNOSIS — S46911A Strain of unspecified muscle, fascia and tendon at shoulder and upper arm level, right arm, initial encounter: Secondary | ICD-10-CM | POA: Diagnosis not present

## 2014-09-14 LAB — POCT CBC
Granulocyte percent: 63 %G (ref 37–80)
HEMATOCRIT: 48 % (ref 43.5–53.7)
HEMOGLOBIN: 16 g/dL (ref 14.1–18.1)
LYMPH, POC: 0.9 (ref 0.6–3.4)
MCH: 28.9 pg (ref 27–31.2)
MCHC: 33.3 g/dL (ref 31.8–35.4)
MCV: 86.7 fL (ref 80–97)
MID (cbc): 0.2 (ref 0–0.9)
MPV: 7.5 fL (ref 0–99.8)
POC GRANULOCYTE: 1.8 — AB (ref 2–6.9)
POC LYMPH PERCENT: 30.7 %L (ref 10–50)
POC MID %: 6.3 %M (ref 0–12)
Platelet Count, POC: 227 10*3/uL (ref 142–424)
RBC: 5.53 M/uL (ref 4.69–6.13)
RDW, POC: 12.7 %
WBC: 2.8 10*3/uL — AB (ref 4.6–10.2)

## 2014-09-14 MED ORDER — ONDANSETRON HCL 8 MG PO TABS: 8.0000 mg | ORAL_TABLET | Freq: Three times a day (TID) | ORAL | Status: DC | PRN

## 2014-09-14 MED ORDER — ONDANSETRON 4 MG PO TBDP
8.0000 mg | ORAL_TABLET | Freq: Once | ORAL | Status: AC
  Administered 2014-09-14: 8 mg via ORAL

## 2014-09-14 NOTE — Patient Instructions (Signed)
I think that you stomach symptoms are not due to anything serious. Use the zofran as needed for nassea I will be in touch with the rest of your labs Let me know if you do not feel better; in that case we can refer you to a gastroenterology specialist.    Let me know if your shoulder bothers you more- if you want to see a therapist for it

## 2014-09-14 NOTE — Progress Notes (Signed)
[facility] [address] [place] [address] [phone]  Date:  09/14/2014   Name:  Calvin Wolf   DOB:  January 03, 1991   MRN:  [id number]  PCP:  No PCP Per Patient    Chief Complaint: Abdominal Pain and Shoulder Pain   History of Present Illness:  Calvin Wolf is a 24 y.o. very pleasant male patient who presents with the following:  He is here today with 3 days of illness- he has had poor appetite over the last 3 days.  He has felt sick to his stomach off and on and just has not felt much like eating No vomiting No abd pain- just feeling nauseated.  He has been stressed with school.    I saw him for a similar episode last October, treated symptomatically and he recovered  He also has noted pain in his right shoulder for a few months- can be worse after exercising (such as running, soccer).  He does not lift weights.  Indicates the right trapezius as the area of concern.     Patient Active Problem List   Diagnosis Date Noted  . 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 Neutropenic fever 07/11/2012  . Lump in neck 07/11/2012  . Immigrant with language difficulty 07/11/2012    Past Medical History  Diagnosis Date  . Anemia age 65    Pt states "I had before anemia"    Past Surgical History  Procedure Laterality Date  . No past surgeries      Social History  Substance Use Topics  . Smoking status: Current Every Day Smoker -- 1.00 packs/day for .5 years  . Smokeless tobacco: Never Used  . Alcohol Use: Yes     Comment: "a little bit"  last time was in June 2014    Family History  Problem Relation Age of Onset  . Diabetes Father     No Known Allergies  Medication list has been reviewed and updated.  No current outpatient prescriptions on file prior to visit.   No current facility-administered medications on file prior to visit.    Review of Systems:  As per HPI- otherwise negative.   Physical Examination: Filed Vitals:   09/14/14 1211   BP: 104/60  Pulse: 101  Temp: 98.4 F (36.9 C)  Resp: 16   Filed Vitals:   09/14/14 1211  Height:  (1.727 m)  Weight: 132 lb (59.875 kg)   Body mass index is 20.08 kg/(m^2). Ideal Body Weight: Weight in (lb) to have BMI = 25: 164.1   Wt Readings from Last 3 Encounters:  09/14/14 132 lb (59.875 kg)  03/02/14 135 lb (61.236 kg)  10/09/13 135 lb (61.236 kg)    GEN: WDWN, NAD, Non-toxic, A & O x 3, looks well HEENT: Atraumatic, Normocephalic. Neck supple. No masses, No LAD.  Bilateral TM wnl, oropharynx normal.  PEERL,EOMI.   Ears and Nose: No external deformity. CV: RRR, No M/G/R. No JVD. No thrill. No extra heart sounds. PULM: CTA B, no wheezes, crackles, rhonchi. No retractions. No resp. distress. No accessory muscle use. ABD: S, NT, ND, +BS. No rebound. No HSM.  Benign belly EXTR: No c/c/e NEURO Normal gait.  PSYCH: Normally interactive. Conversant. Not depressed or anxious appearing.  Calm demeanor.  Right shoulder: his shoulder pops with ROM but no pain, full ROM in all directions. Mild soreness of the trapzeius muscle   BP Readings from Last 3 Encounters:  09/14/14 104/60  03/02/14 94/64  10/09/13 110/68  Results for orders placed or performed in visit on 09/14/14  POCT CBC  Result Value Ref Range   WBC 2.8 (A) 4.6 - 10.2 K/uL   Lymph, poc 0.9 0.6 - 3.4   POC LYMPH PERCENT 30.7 10 - 50 %L   MID (cbc) 0.2 0 - 0.9   POC MID % 6.3 0 - 12 %M   POC Granulocyte 1.8 (A) 2 - 6.9   Granulocyte percent 63.0 37 - 80 %G   RBC 5.53 4.69 - 6.13 M/uL   Hemoglobin 16.0 14.1 - 18.1 g/dL   HCT, POC 16.1 09.6 - 53.7 %   MCV 86.7 80 - 97 fL   MCH, POC 28.9 27 - 31.2 pg   MCHC 33.3 31.8 - 35.4 g/dL   RDW, POC 04.5 %   Platelet Count, POC 227.0 142 - 424 K/uL   MPV 7.5 0 - 99.8 fL   Given 8 mg of po zofran and he felt improved, wanted to go and eat Assessment and Plan: Nausea without vomiting - Plan: ondansetron (ZOFRAN-ODT) disintegrating tablet 8 mg, POCT CBC,  Comprehensive metabolic panel  Right shoulder strain, initial encounter  Non-intractable vomiting with nausea, vomiting of unspecified type - Plan: ondansetron (ZOFRAN) 8 MG tablet  Here today with nausea, no vomiting for the last few days He is still leukopenic but not dangerously so, not neutropenic.  His exam and history do not suggest a dangerous etioloy of his sx Will treat with prn zofran, await CMP.  He will let me know if getting worse  Signed Abbe Amsterdam, MD

## 2014-09-15 LAB — COMPREHENSIVE METABOLIC PANEL
ALT: 21 U/L (ref 9–46)
AST: 17 U/L (ref 10–40)
Albumin: 4.8 g/dL (ref 3.6–5.1)
Alkaline Phosphatase: 66 U/L (ref 40–115)
BILIRUBIN TOTAL: 1.2 mg/dL (ref 0.2–1.2)
BUN: 13 mg/dL (ref 7–25)
CO2: 25 mmol/L (ref 20–31)
CREATININE: 0.74 mg/dL (ref 0.60–1.35)
Calcium: 9.9 mg/dL (ref 8.6–10.3)
Chloride: 103 mmol/L (ref 98–110)
GLUCOSE: 90 mg/dL (ref 65–99)
Potassium: 4.2 mmol/L (ref 3.5–5.3)
SODIUM: 137 mmol/L (ref 135–146)
Total Protein: 7.3 g/dL (ref 6.1–8.1)

## 2014-09-16 ENCOUNTER — Encounter: Payer: Self-pay | Admitting: Family Medicine

## 2014-11-06 ENCOUNTER — Emergency Department (HOSPITAL_COMMUNITY): Payer: 59

## 2014-11-06 ENCOUNTER — Encounter (HOSPITAL_COMMUNITY): Payer: Self-pay | Admitting: *Deleted

## 2014-11-06 ENCOUNTER — Emergency Department (HOSPITAL_COMMUNITY)
Admission: EM | Admit: 2014-11-06 | Discharge: 2014-11-06 | Disposition: A | Discharge status: Home / Self Care | Payer: 59 | Attending: Emergency Medicine | Admitting: Emergency Medicine

## 2014-11-06 DIAGNOSIS — Z720 Tobacco use: Secondary | ICD-10-CM | POA: Insufficient documentation

## 2014-11-06 DIAGNOSIS — R112 Nausea with vomiting, unspecified: Secondary | ICD-10-CM | POA: Insufficient documentation

## 2014-11-06 DIAGNOSIS — Z862 Personal history of diseases of the blood and blood-forming organs and certain disorders involving the immune mechanism: Secondary | ICD-10-CM | POA: Insufficient documentation

## 2014-11-06 LAB — CBC
HCT: 48.2 % (ref 39.0–52.0)
HEMOGLOBIN: 16.7 g/dL (ref 13.0–17.0)
MCH: 30.9 pg (ref 26.0–34.0)
MCHC: 34.6 g/dL (ref 30.0–36.0)
MCV: 89.1 fL (ref 78.0–100.0)
Platelets: 214 10*3/uL (ref 150–400)
RBC: 5.41 MIL/uL (ref 4.22–5.81)
RDW: 12.6 % (ref 11.5–15.5)
WBC: 2.8 10*3/uL — ABNORMAL LOW (ref 4.0–10.5)

## 2014-11-06 LAB — URINALYSIS, ROUTINE W REFLEX MICROSCOPIC
BILIRUBIN URINE: NEGATIVE
GLUCOSE, UA: NEGATIVE mg/dL
HGB URINE DIPSTICK: NEGATIVE
Ketones, ur: NEGATIVE mg/dL
Leukocytes, UA: NEGATIVE
Nitrite: NEGATIVE
PH: 8 (ref 5.0–8.0)
Protein, ur: NEGATIVE mg/dL
SPECIFIC GRAVITY, URINE: 1.013 (ref 1.005–1.030)
UROBILINOGEN UA: 0.2 mg/dL (ref 0.0–1.0)

## 2014-11-06 LAB — COMPREHENSIVE METABOLIC PANEL
ALK PHOS: 59 U/L (ref 38–126)
ALT: 25 U/L (ref 17–63)
ANION GAP: 7 (ref 5–15)
AST: 21 U/L (ref 15–41)
Albumin: 4.8 g/dL (ref 3.5–5.0)
BUN: 11 mg/dL (ref 6–20)
CALCIUM: 9.9 mg/dL (ref 8.9–10.3)
CHLORIDE: 101 mmol/L (ref 101–111)
CO2: 29 mmol/L (ref 22–32)
Creatinine, Ser: 0.77 mg/dL (ref 0.61–1.24)
GFR calc non Af Amer: >60 mL/min (ref >60)
Glucose, Bld: 107 mg/dL — ABNORMAL HIGH (ref 65–99)
POTASSIUM: 4.4 mmol/L (ref 3.5–5.1)
SODIUM: 137 mmol/L (ref 135–145)
Total Bilirubin: 0.8 mg/dL (ref 0.3–1.2)
Total Protein: 7.9 g/dL (ref 6.5–8.1)

## 2014-11-06 LAB — LIPASE, BLOOD: LIPASE: 21 U/L (ref 11–51)

## 2014-11-06 MED ORDER — ONDANSETRON 4 MG PO TBDP: 4.0000 mg | ORAL_TABLET | Freq: Three times a day (TID) | ORAL | Status: DC | PRN

## 2014-11-06 MED ORDER — IOHEXOL 300 MG/ML  SOLN
100.0000 mL | Freq: Once | INTRAMUSCULAR | Status: AC | PRN
  Administered 2014-11-06: 100 mL via INTRAVENOUS

## 2014-11-06 NOTE — Discharge Instructions (Signed)
1. Medications: zofran, usual home medications 2. Treatment: rest, drink plenty of fluids 3. 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 Vomiting is a reflex where stomach contents come out of your mouth. Vomiting can cause severe loss of body fluids (dehydration). Children and elderly adults can become dehydrated quickly, especially if they also have diarrhea. Nausea and vomiting are symptoms of a condition or disease. It is important to find the cause of your symptoms. CAUSES   Direct irritation of the stomach lining. This irritation can result from increased acid production (gastroesophageal reflux disease), infection, food poisoning, taking certain medicines (such as nonsteroidal anti-inflammatory drugs), alcohol use, or tobacco use.  Signals from the brain.These signals could be caused by a headache, heat exposure, an inner ear disturbance, increased pressure in the brain from injury, infection, a tumor, or a concussion, pain, emotional stimulus, or metabolic problems.  An obstruction in the gastrointestinal tract (bowel obstruction).  Illnesses such as diabetes, hepatitis, gallbladder problems, appendicitis, kidney problems, cancer, sepsis, atypical symptoms of a heart attack, or eating disorders.  Medical treatments such as chemotherapy and radiation.  Receiving medicine that makes you sleep (general anesthetic) during surgery. DIAGNOSIS Your caregiver may ask for tests to be done if the problems do not improve after a few days. Tests may also be done if symptoms are severe or if the reason  for the nausea and vomiting is not clear. Tests may include:  Urine tests.  Blood tests.  Stool tests.  Cultures (to look for evidence of infection).  X-rays or other imaging studies. Test results can help your caregiver make decisions about treatment or the need for additional tests. TREATMENT You need to stay well hydrated. Drink frequently but in small amounts.You may wish to drink water, sports drinks, clear broth, or eat frozen ice pops or gelatin dessert to help stay hydrated.When you eat, eating slowly may help prevent nausea.There are also some antinausea medicines that may help prevent nausea. HOME CARE INSTRUCTIONS   Take all medicine as directed by your caregiver.  If you do not have an appetite, do not force yourself to eat. However, you must continue to drink fluids.  If you have an appetite, eat a normal diet unless your caregiver tells you differently.  Eat a variety of complex carbohydrates (rice, wheat, potatoes, bread), lean meats, yogurt, fruits, and vegetables.  Avoid high-fat foods because they are more difficult to digest.  Drink enough water and fluids to keep your urine clear or pale yellow.  If you are dehydrated, ask your caregiver for specific rehydration instructions. Signs of dehydration may include:  Severe thirst.  Dry lips and mouth.  Dizziness.  Dark urine.  Decreasing urine frequency and amount.  Confusion.  Rapid breathing or pulse. SEEK IMMEDIATE MEDICAL CARE IF:   You have blood or brown flecks (like coffee grounds) in your vomit.  You have black or bloody stools.  You have a severe headache or stiff neck.  You are confused.  You have severe abdominal pain.  You have chest pain or trouble breathing.  You do not urinate at least once every  8 hours.  You develop cold or clammy skin.  You continue to vomit for longer than 24 to 48 hours.  You have a fever. MAKE SURE YOU:   Understand these instructions.  Will  watch your condition.  Will get help right away if you are not doing well or get worse.   This information is not intended to replace advice given to you by your health care provider. Make sure you discuss any questions you have with your health care provider.   Document Released: 12/22/2004 Document Revised: 03/16/2011 Document Reviewed: 05/21/2010 Elsevier Interactive Patient Education 2016 [employer].   Emergency Department Resource Guide 1) Find a Doctor and Pay Out of Pocket Although you won't have to find out who is covered by your insurance plan, it is a good idea to ask around and get recommendations. You will then need to call the office and see if the doctor you have chosen will accept you as a new patient and what types of options they offer for patients who are self-pay. Some doctors offer discounts or will set up payment plans for their patients who do not have insurance, but you will need to ask so you aren't surprised when you get to your appointment.  2) Contact Your Local Health Department Not all health departments have doctors that can see patients for sick visits, but many do, so it is worth a call to see if yours does. If you don't know where your local health department is, you can check in your phone book. The CDC also has a tool to help you locate your state's health department, and many state websites also have listings of all of their local health departments.  3) Find a Walk-in Clinic If your illness is not likely to be very severe or complicated, you may want to try a walk in clinic. These are popping up all over the country in pharmacies, drugstores, and shopping centers. They're usually staffed by nurse practitioners or physician assistants that have been trained to treat common illnesses and complaints. They're usually fairly quick and inexpensive. However, if you have serious medical issues or chronic medical problems, these are probably not your best  option.  No Primary Care Doctor: - Call Health Connect at  [phone] - they can help you locate a primary care doctor that  accepts your insurance, provides certain services, etc. - Physician Referral Service- [phone]  Chronic Pain Problems: Organization         Address  Phone   Notes  [facility]  [phone] Patients need to be referred by their primary care doctor.   Medication Assistance: Organization         Address  Phone   Notes  [facility] Medication [facility] [address] [place] [place], [place] [address] [phone] --Must be a resident of [facility] -- Must have NO insurance coverage whatsoever (no Medicaid/ Medicare, etc.) -- The pt. MUST have a primary care doctor that directs their care regularly and follows them in the community   [facility]  [phone]   [employer]  [phone]    Agencies that provide inexpensive medical care: Organization         Address  Phone   Notes  [facility]  [phone]   [facility]    [phone]   [facility] [address] [place], [place] [address] [phone]   [facility] [address] [place] [phone]   Planned Parenthood    [phone]   [facility]    [phone]   [facility]  [address], [place] Phone:  [phone], Fax:  [phone] Hours of Operation:  9 am - 6 pm, M-F.  Also accepts Medicaid/Medicare and self-pay.  [facility]  [address], [place] Phone: [phone], Fax: [phone]. Hours of Operation:  8:30 am - 5:30 pm, M-F.  Also accepts Medicaid and self-pay.  [facility] [place] [address], [place] Phone: [phone]   [facility] [address] Natasha Bence [place], [place] [phone], Ext. [phone] Mondays & Thursdays: 7-9 AM.  First 15  patients are seen on a first come, first serve basis.    Medicaid-accepting [facility] Providers:  Organization         Address  Phone   Notes  [facility] [address],  [phone] Also accepts self-pay patients.  [facility] [address] Laurell Josephs [place], [place]  [phone]   [facility] [address], [place] [phone]   [facility] [address], [place] [phone]   Renaye Rakers [address], [place]   [phone] Only accepts [place] patients after they have their name applied to their card.   Self-Pay (no insurance) in [facility]:  Organization         Address  Phone   Notes  Sickle Cell Patients, [facility] [address] [place], [place] [phone]   [facility] [address] [place], [place] [phone]   [facility] [place]  [address], [place] [phone]   [facility]/Dr. Osei-Bonsu  [address], [place] or [address], [place] [phone] Phone number for both [place] and [place] locations is the same.  [facility] [address] [place] [phone]   [facility] [address] [place] or [address] [phone]   [facility] [address], [place] [phone], phone; [phone], fax Sees patients 1st and 3rd Saturday of every month.  Must not qualify for public or private insurance (i.e. Medicaid, Medicare, [place] Health Choice, Veterans' Benefits)  Household income should be no more than 200% of the poverty level The clinic cannot treat you if you are pregnant or think you are pregnant  Sexually transmitted diseases are not treated at the clinic.    Dental  Care: Organization         Address  Phone  Notes  [facility] [address] [place], [place] [phone] Accepts children up to age 32 who are enrolled in [place] or [place] Health Choice; pregnant women with a Medicaid card; and children who have applied for Medicaid or [place] Health Choice, but were declined, whose parents can pay a reduced fee at time of service.  [facility] Department of [facility]  [address], [place] [phone] Accepts children up to age 58 who are enrolled in [place] or [place] Health Choice; pregnant women with a Medicaid card; and children who  have applied for Medicaid or [place] Health Choice, but were declined, whose parents can pay a reduced fee at time of service.  [facility]  [address] [place], [place] [phone] Patients are seen by appointment only. Walk-ins are not accepted. [facility] will see patients 50 years of age and older. Monday - Tuesday (8am-5pm) Most Wednesdays (8:30-5pm) $30 per visit, cash only  [facility] Adult [facility] Access [facility]  [address], [facility] [phone] Patients are seen by appointment only. Walk-ins are not accepted. [facility] will see patients 7 years of age and older. One Wednesday Evening (Monthly: Volunteer Based).  $30 per visit, cash only  [employer]  [phone] for adults; Children under age 45, call Graduate Pediatric Dentistry at [phone]. Children aged 60-14, please call [phone] to request a pediatric application.  Dental services are provided in all areas of dental care including fillings, crowns and bridges, complete and partial dentures, implants, gum treatment, root canals, and extractions. Preventive care is also provided. Treatment is provided to both adults and children. Patients are selected via a lottery and there is often a waiting list.   [facility] [address], [place]  [phone] [email or web address]   [facility] [address] [place], [place] [phone], Ext. [phone] Second and Fourth Thursday of each month, opens at 6:30 AM; Clinic ends at 9 AM.  Patients are seen on a first-come first-served basis, and a limited number are seen during each clinic.   [facility]  [address] Ether Griffins [place], [place] [phone]   Eligibility Requirements You must have lived in [place], [place], or [place] counties for at least the last three months.   You cannot be eligible for state or federal sponsored [place], including [employer], [place], or [employer].   You generally cannot be eligible for healthcare insurance through your employer.    How to apply: Eligibility screenings are held every Tuesday and Wednesday afternoon from 1:00 pm until 4:00 pm. You do not need an appointment for the interview!  [facility] [address] [place], [place] [phone]   [facility] Health Department  [phone]   [facility] Health Department  [phone]   [facility] Health Department  [phone]    Behavioral Health Resources in the Community: Intensive Outpatient Programs Organization         Address  Phone  Notes  [facility] [address] [place], [place] [phone]   [facility] [address] [place], [place] [phone]   [facility] [address], [place], [place]  [phone]   [facility] Mental Health [address]  [place], [place] [phone] or [phone]   Substance Abuse Resources Organization         Address  Phone  Notes  [facility]  [phone]   [facility]  [phone]   [facility] [place]  [phone]   Floydene Flock  [phone]   Residential & Outpatient Substance Abuse Program  [phone]    Psychological Services Organization         Address  Phone  Notes  [facility] Behavioral Health  [phone]   [facility] Services  [phone]   [facility] [address] [place] [phone] or [phone]    Mobile Crisis Teams Organization  Address  Phone  Notes  [facility], Mobile Crisis Care Unit  [phone]   [facility]  [address] [place], [place]   [facility] [address], [place] [phone]    Self-Help/Support Groups Organization         Address  Phone             Notes  [place] [place] - variety of support groups  [place] Call for more information  Narcotics Anonymous (NA), [facility] [address], [employer] [place]  2 meetings at this location   Special educational needs teacher         Address  Phone  Notes  [facility] [place],    [place]  [phone]   [facility]  [address] [place] [address], [place], [place]   [place], [place] [phone] Admissions: 8am-3pm M-F  Incentives Substance [place] [address]    [place], [place] [phone]   [facility] [address] [place], [place], [place]   [facility] [address]  [place], [place]   [facility] - Intensive Outpatient [address] Kristeen Mans [address], [place], [place]   [facility]  [place], [place] [phone] or [phone]   [facility] ([facility]) [address] [place], [place] Accepts Medicaid  [facility] [place] [address],  [place] [phone] Substance Abuse/Addiction Treatment   [facility] Organization         Address  Phone  Notes  [facility]  [phone]   Domenic Schwab, PhD [address] Arlis Porta [place], [place]   [phone] or [phone]   [place]wood York Isabela [place], [place] [phone]   [facility] [address], [place], [place] [phone] Insurance/Medicaid/sponsorship through [facility] and Families [address]                                    [place], [place] [phone] [place] [address], [place] [phone]    Dr. Adele Schilder  [phone]   [facility] Dept. 1) [address], [place] 2) [place] 3)  [address], [place] [phone]  [phone]   [place] [phone] or [phone] (After Hours)

## 2014-11-06 NOTE — ED Notes (Addendum)
Pt reports he has hx of anemia, went to an urgent care 2 weeks ago for vomiting, was told he had anemia, urgent care wanted him to go to ED that day, but pt refused. Pt reports he has been vomiting x1 per day in the morning for last 2 weeks. Denies blood in vomit. Denies pain. Denies blood in urine or stool.

## 2014-11-06 NOTE — ED Notes (Signed)
Patient was alert, oriented and stable upon discharge. RN went over AVS and patient had no further questions.  

## 2014-11-06 NOTE — ED Provider Notes (Signed)
CSN: [id number]     Arrival date & time 11/06/14  1424 History   First MD Initiated Contact with Patient 11/06/14 1618     Chief Complaint  Patient presents with  . Anemia  . Emesis    HPI   Calvin Wolf is a 24 y.o. male with a PMH of anemia who presents to the ED with anemia and emesis. He states he was told he was anemic by urgent care several weeks ago, and reports he has experienced intermittent nausea and vomiting over the past month and a half. He reports his symptoms occur almost every morning, and are typically precipitated by eating. He has not tried anything for symptom relief. He denies fever, chills, lightheadedness, dizziness, chest pain, shortness of breath, abdominal pain, diarrhea, constipation, hematemesis, melena, hematochezia. He reports his last episode of emesis was yesterday morning.    Past Medical History  Diagnosis Date  . Anemia age 24    Pt states "I had before anemia"   Past Surgical History  Procedure Laterality Date  . No past surgeries     Family History  Problem Relation Age of Onset  . Diabetes Father    Social History  Substance Use Topics  . Smoking status: Current Every Day Smoker -- 1.00 packs/day for .5 years  . Smokeless tobacco: Never Used  . Alcohol Use: Yes     Comment: "a little bit"  last time was in June 2014    Review of Systems  Constitutional: Negative for fever and chills.  Respiratory: Negative for shortness of breath.   Cardiovascular: Negative for chest pain.  Gastrointestinal: Positive for nausea and vomiting. Negative for abdominal pain, diarrhea, constipation and blood in stool.  Neurological: Negative for dizziness and light-headedness.  All other systems reviewed and are negative.     Allergies  Review of patient's allergies indicates no known allergies.  Home Medications   Prior to Admission medications   Medication Sig Start Date End Date Taking? Authorizing Provider  ALPRAZolam Prudy Feeler(XANAX) 1 MG tablet  Take 1 mg by mouth daily as needed for anxiety.   Yes Historical Provider, MD  ondansetron (ZOFRAN) 8 MG tablet Take 1 tablet (8 mg total) by mouth every 8 (eight) hours as needed for nausea or vomiting. 09/14/14  Yes Gwenlyn FoundJessica C Copland, MD  ondansetron (ZOFRAN ODT) 4 MG disintegrating tablet Take 1 tablet (4 mg total) by mouth every 8 (eight) hours as needed for nausea. 11/06/14   Mady GemmaElizabeth C Carel Schnee, PA-C    BP 112/53 mmHg  Pulse 59  Temp(Src) 97.8 F (36.6 C) (Oral)  Resp 18  SpO2 100% Physical Exam  Constitutional: He is oriented to person, place, and time. He appears well-developed and well-nourished. No distress.  HENT:  Head: Normocephalic and atraumatic.  Right Ear: External ear normal.  Left Ear: External ear normal.  Nose: Nose normal.  Mouth/Throat: Uvula is midline, oropharynx is clear and moist and mucous membranes are normal.  Eyes: Conjunctivae, EOM and lids are normal. Pupils are equal, round, and reactive to light. Right eye exhibits no discharge. Left eye exhibits no discharge. No scleral icterus.  Neck: Normal range of motion. Neck supple.  Cardiovascular: Normal rate, regular rhythm, normal heart sounds, intact distal pulses and normal pulses.   Pulmonary/Chest: Effort normal and breath sounds normal. No respiratory distress. He has no wheezes. He has no rales.  Abdominal: Soft. Normal appearance and bowel sounds are normal. He exhibits no distension and no mass. There is no tenderness. There  is no rigidity, no rebound and no guarding.  Musculoskeletal: Normal range of motion. He exhibits no edema or tenderness.  Neurological: He is alert and oriented to person, place, and time. He has normal strength. No sensory deficit.  Skin: Skin is warm, dry and intact. No rash noted. He is not diaphoretic. No erythema. No pallor.  Psychiatric: He has a normal mood and affect. His speech is normal and behavior is normal.  Nursing note and vitals reviewed.   ED Course  Procedures  (including critical care time)  Labs Review Labs Reviewed  COMPREHENSIVE METABOLIC PANEL - Abnormal; Notable for the following:    Glucose, Bld 107 (*)    All other components within normal limits  CBC - Abnormal; Notable for the following:    WBC 2.8 (*)    All other components within normal limits  LIPASE, BLOOD  URINALYSIS, ROUTINE W REFLEX MICROSCOPIC (NOT AT [facility])    Imaging Review US Abdomen Complete  11/06/2014  CLINICAL DATA:  Abdominal pain, nausea and vomiting EXAM: ULTRASOUND ABDOMEN COMPLETE COMPARISON:  CT scan 07/12/2012 FINDINGS: Gallbladder: No gallstones or wall thickening visualized. No sonographic Murphy sign noted. Common bile duct: Diameter: 3/5 mm in diameter within normal limits. Liver: No intrahepatic biliary ductal dilatation. 8 mm hyperechoic lesion inferior aspect of right hepatic lobe most likely hemangioma. IVC: No abnormality visualized. Pancreas: Visualized portion unremarkable. Spleen: Size and appearance within normal limits. Measures 9.7 cm in length. Right Kidney: Length: 11.4 cm. Echogenicity within normal limits. No mass or hydronephrosis visualized. Left Kidney: Length: 11.6 cm. Echogenicity within normal limits. No mass or hydronephrosis visualized. Abdominal aorta: No aneurysm visualized. Measures up to 1.8 cm in diameter. Other findings: None. IMPRESSION: 1. No gallstones are noted within gallbladder.  Normal CBD. 2. Question hemangioma inferior aspect of right hepatic lobe measures 8 mm. Confirmation with enhanced CT or MRI could be performed as clinically warranted. 3. No hydronephrosis. 4. No aortic aneurysm. Electronically Signed   By: Natasha Mead M.D.   On: 11/06/2014 18:01   Ct Abdomen Pelvis W Contrast  11/06/2014  CLINICAL DATA:  Emesis for 1 month. EXAM: CT ABDOMEN AND PELVIS WITH CONTRAST TECHNIQUE: Multidetector CT imaging of the abdomen and pelvis was performed using the standard protocol following bolus administration of intravenous contrast.  Oral contrast was also administered. CONTRAST:  OMNIPAQUE IOHEXOL 300 MG/ML  SOLN COMPARISON:  July 12, 2012 FINDINGS: Lower chest:  Lung bases are clear. Hepatobiliary: There is a 4 mm cyst in the right lobe of the liver slightly inferior to the caudate lobe. No other focal liver lesions are identified. Gallbladder wall is not appreciably thickened. There is no biliary duct dilatation. Pancreas: No mass or inflammatory focus. Spleen: No splenic lesions are appreciable. Previously noted splenomegaly is no longer apparent. Adrenals/Urinary Tract: Adrenals appear normal bilaterally. Kidneys bilaterally show no mass or hydronephrosis on either side. There is no renal or ureteral calculus on either side. The urinary bladder is midline with wall thickness within normal limits. Stomach/Bowel: There is no bowel wall or mesenteric thickening. There is no bowel obstruction. No free air or portal venous air. Vascular/Lymphatic: There is no appreciable abdominal aortic aneurysm. No vascular lesions are appreciable. There is no demonstrable adenopathy in the abdomen or pelvis. Reproductive: Prostate is normal in size and contour. There is no pelvic mass. Other: Appendix appears normal. No abscess or ascites in the abdomen or pelvis. Musculoskeletal: There are no blastic or lytic bone lesions. A small bone island is  noted in the right femoral neck incidentally. No intramuscular or abdominal wall lesions are appreciable. IMPRESSION: A cause for patient's symptoms has not been established with this study. No bowel wall thickening or bowel obstruction. No abscess. Appendix appears normal. No renal or ureteral calculus.  No hydronephrosis.  No adenopathy. Electronically Signed   By: Bretta Bang III M.D.   On: 11/06/2014 21:07     I have personally reviewed and evaluated these images and lab results as part of my medical decision-making.   EKG Interpretation None      MDM   Final diagnoses:  Non-intractable  vomiting with nausea, vomiting of unspecified type    24 year old male presents with emesis x one and a half months. Also was told he was anemic by urgent care. Denies fever, chills, lightheadedness, dizziness, chest pain, shortness of breath, abdominal pain, diarrhea, constipation, hematemesis, melena, hematochezia. His last episode of emesis was yesterday morning. Currently, he is asymptomatic.   Patient is afebrile. Vital signs stable. Mucous membranes moist. Heart RRR. Lungs clear to auscultation bilaterally. Abdomen soft, non-tender, non-distended. No rebound, guarding, or palpable masses.  CBC remarkable for WBC 2.8, which appears chronic and stable. Hemoglobin 16.7, no evidence of anemia. CMP and lipase within normal limits. UA negative for infection.   Will obtain RUQ [place] for further evaluation of symptoms. RUQ [place] remarkable for no gallstones, normal CBD, question hemangioma inferior aspect right hepatic lobe (confirmation with CT or MRI as clinically warranted), no hydronephrosis, no aortic aneurysm. Will obtain CT. CT abdomen pelvis remarkable for 4 mm cyst in right lobe of liver, negative for bowel wall thickening, bowel obstruction, abscess, renal or ureteral calculus, adenopathy.   Discussed findings with patient. Patient to follow-up with PCP. Return precautions discussed. Patient verbalizes his understanding and is in agreement with plan. Will give zofran for symptoms.  Patient discussed with and seen by Dr. Manus Gunning.  BP 112/53 mmHg  Pulse 59  Temp(Src) 97.8 F (36.6 C) (Oral)  Resp 18  SpO2 100%       Mady Gemma, PA-C 11/06/14 2359  Glynn Octave, MD 11/07/14 [phone]

## 2014-11-06 NOTE — Progress Notes (Signed)
Patient listed as having U[employer] insurance without a pcp.  [facility] within a 10 mile radius of patient's zip code [phone].  Patient thankful for resources.  No further EDCM needs at this time.

## 2015-04-03 ENCOUNTER — Ambulatory Visit (INDEPENDENT_AMBULATORY_CARE_PROVIDER_SITE_OTHER): Payer: PPO | Admitting: Urgent Care

## 2015-04-03 ENCOUNTER — Ambulatory Visit (INDEPENDENT_AMBULATORY_CARE_PROVIDER_SITE_OTHER): Payer: PPO

## 2015-04-03 VITALS — BP 102/78 | HR 107 | Temp 98.0°F | Resp 17 | Ht 68.5 in | Wt 126.0 lb

## 2015-04-03 DIAGNOSIS — R0789 Other chest pain: Secondary | ICD-10-CM

## 2015-04-03 DIAGNOSIS — R0602 Shortness of breath: Secondary | ICD-10-CM

## 2015-04-03 DIAGNOSIS — R059 Cough, unspecified: Secondary | ICD-10-CM

## 2015-04-03 DIAGNOSIS — R5383 Other fatigue: Secondary | ICD-10-CM

## 2015-04-03 DIAGNOSIS — F17200 Nicotine dependence, unspecified, uncomplicated: Secondary | ICD-10-CM

## 2015-04-03 DIAGNOSIS — R51 Headache: Secondary | ICD-10-CM | POA: Diagnosis not present

## 2015-04-03 DIAGNOSIS — R05 Cough: Secondary | ICD-10-CM

## 2015-04-03 DIAGNOSIS — R519 Headache, unspecified: Secondary | ICD-10-CM

## 2015-04-03 MED ORDER — AZITHROMYCIN 250 MG PO TABS: ORAL_TABLET | ORAL | Status: AC

## 2015-04-03 MED ORDER — CETIRIZINE HCL 10 MG PO TABS: 10.0000 mg | ORAL_TABLET | Freq: Every day | ORAL | Status: AC

## 2015-04-03 MED ORDER — OSELTAMIVIR PHOSPHATE 75 MG PO CAPS: 75.0000 mg | ORAL_CAPSULE | Freq: Two times a day (BID) | ORAL | Status: AC

## 2015-04-03 MED ORDER — HYDROCODONE-HOMATROPINE 5-1.5 MG/5ML PO SYRP: 5.0000 mL | ORAL_SOLUTION | Freq: Every evening | ORAL | Status: AC | PRN

## 2015-04-03 MED ORDER — BENZONATATE 100 MG PO CAPS: 100.0000 mg | ORAL_CAPSULE | Freq: Three times a day (TID) | ORAL | Status: AC | PRN

## 2015-04-03 NOTE — Progress Notes (Signed)
    MRN: [id number] DOB: Oct 23, 1990  Subjective:   Calvin Wolf is a 25 y.o. male presenting for chief complaint of Cough  Reports 3 week history of persistent runny nose, congestion. The past 2 days this has progressed to headache, productive cough, cough elicits chest pain, shob and nausea, fatigue, intermittent achiness. Has been using Flonase twice daily for ~2 weeks with minimal relief. Smokes 1ppd for 4 years, not interested in quitting. 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 Has not been resting.     Calvin Wolf currently has no medications in their medication list. Also has No Known Allergies.  Calvin Wolf  has a past medical history of Anemia (age 25). Also  has past surgical history that includes No past surgeries.  Objective:   Vitals: BP 102/78 mmHg  Pulse 107  Temp(Src) 98 F (36.7 C) (Oral)  Resp 17  Ht 5' 8.5" (1.74 m)  Wt 126 lb (57.153 kg)  BMI 18.88 kg/m2  SpO2 98%  Physical Exam  Constitutional: He is oriented to person, place, and time. He appears well-developed and well-nourished.  HENT:  TM's intact bilaterally, no effusions or erythema. Nasal turbinates pink and moist, nasal passages patent. No sinus tenderness. Oropharynx clear, mucous membranes moist, dentition in good repair.  Eyes: Right eye exhibits no discharge. Left eye exhibits no discharge. No scleral icterus.  Neck: Normal range of motion. Neck supple.  Cardiovascular: Normal rate, regular rhythm and intact distal pulses.  Exam reveals no gallop and no friction rub.   No murmur heard. Pulmonary/Chest: No respiratory distress. He has no wheezes. He has no rales.  Coarse lung sounds at lower bases bilaterally.  Lymphadenopathy:    He has no cervical adenopathy.  Neurological: He is alert and oriented to person, place, and time.  Skin: Skin is warm and dry.   Dg Chest 2 View  04/03/2015  CLINICAL DATA:  Cough for 3 weeks. EXAM: CHEST  2 VIEW COMPARISON:  August 26, 2012 FINDINGS: The  heart size and mediastinal contours are within normal limits. Both lungs are clear. The visualized skeletal structures are unremarkable. IMPRESSION: No active cardiopulmonary disease. Electronically Signed   By: Lupita RaiderJames  Green Jr, M.D.   On: 04/03/2015 15:47   Assessment and Plan :   1. Cough 2. Atypical chest pain 3. Shortness of breath 4. Other fatigue 5. Headache, unspecified headache type - Will cover patient for CAP secondary to what I suspect is pneumonia. Patient would not agree to rest due to his school responsibilities. I provided him with a school note anyway. He is to rtc on Monday if he is still not better.  6. Tobacco use disorder - Counseled on risks of smoking  Wallis BambergMario Cloys Vera, PA-C [facility] [phone] 04/03/2015 3:20 PM

## 2015-04-03 NOTE — Patient Instructions (Addendum)
Influenza, Adult Influenza ("the flu") is a viral infection of the respiratory tract. It occurs more often in winter months because people spend more time in close contact with one another. Influenza can make you feel very sick. Influenza easily spreads from person to person (contagious). CAUSES  Influenza is caused by a virus that infects the respiratory tract. You can catch the virus by breathing in droplets from an infected person's cough or sneeze. You can also catch the virus by touching something that was recently contaminated with the virus and then touching your mouth, nose, or eyes. RISKS AND COMPLICATIONS You may be at risk for a more severe case of influenza if you smoke cigarettes, have diabetes, have chronic heart disease (such as heart failure) or lung disease (such as asthma), or if you have a weakened immune system. Elderly people and pregnant women are also at risk for more serious infections. The most common problem of influenza is a lung infection (pneumonia). Sometimes, this problem can require emergency medical care and may be life threatening. SIGNS AND SYMPTOMS  Symptoms typically last 4 to 10 days and may include:  Fever.  Chills.  Headache, body aches, and muscle aches.  Sore throat.  Chest discomfort and cough.  Poor appetite.  Weakness or feeling tired.  Dizziness.  Nausea or vomiting. DIAGNOSIS  Diagnosis of influenza is often made based on your history and a physical exam. A nose or throat swab test can be done to confirm the diagnosis. TREATMENT  In mild cases, influenza goes away on its own. Treatment is directed at relieving symptoms. For more severe cases, your health care provider may prescribe antiviral medicines to shorten the sickness. Antibiotic medicines are not effective because the infection is caused by a virus, not by bacteria. HOME CARE INSTRUCTIONS  Take medicines only as directed by your health care provider.  Use a cool mist humidifier  to make breathing easier.  Get plenty of rest until your temperature returns to normal. This usually takes 3 to 4 days.  Drink enough fluid to keep your urine clear or pale yellow.  Cover yourmouth and nosewhen coughing or sneezing,and wash your handswellto prevent thevirusfrom spreading.  Stay homefromwork orschool untilthe fever is gonefor at least 241full day. PREVENTION  An annual influenza vaccination (flu shot) is the best way to avoid getting influenza. An annual flu shot is now routinely recommended for all adults in the [place] SEEK MEDICAL CARE IF:  You experiencechest pain, yourcough worsens,or you producemore mucus.  Youhave nausea,vomiting, ordiarrhea.  Your fever returns or gets worse. SEEK IMMEDIATE MEDICAL CARE IF:  You havetrouble breathing, you become short of breath,or your skin ornails becomebluish.  You have severe painor stiffnessin the neck.  You develop a sudden headache, or pain in the face or ear.  You have nausea or vomiting that you cannot control. MAKE SURE YOU:   Understand these instructions.  Will watch your condition.  Will get help right away if you are not doing well or get worse.   This information is not intended to replace advice given to you by your health care provider. Make sure you discuss any questions you have with your health care provider.   Document Released: 12/20/1999 Document Revised: 01/12/2014 Document Reviewed: 03/23/2011 Elsevier Interactive Patient Education 2016 Elsevier Inc.   Cough, Adult Coughing is a reflex that clears your throat and your airways. Coughing helps to heal and protect your lungs. It is normal to cough occasionally, but a cough that happens with  other symptoms or lasts a long time may be a sign of a condition that needs treatment. A cough may last only 2-3 weeks (acute), or it may last longer than 8 weeks (chronic). CAUSES Coughing is commonly caused by:  Breathing in  substances that irritate your lungs.  A viral or bacterial respiratory infection.  Allergies.  Asthma.  Postnasal drip.  Smoking.  Acid backing up from the stomach into the esophagus (gastroesophageal reflux).  Certain medicines.  Chronic lung problems, including COPD (or rarely, lung cancer).  Other medical conditions such as heart failure. HOME CARE INSTRUCTIONS  Pay attention to any changes in your symptoms. Take these actions to help with your discomfort:  Take medicines only as told by your health care provider.  If you were prescribed an antibiotic medicine, take it as told by your health care provider. Do not stop taking the antibiotic even if you start to feel better.  Talk with your health care provider before you take a cough suppressant medicine.  Drink enough fluid to keep your urine clear or pale yellow.  If the air is dry, use a cold steam vaporizer or humidifier in your bedroom or your home to help loosen secretions.  Avoid anything that causes you to cough at work or at home.  If your cough is worse at night, try sleeping in a semi-upright position.  Avoid cigarette smoke. If you smoke, quit smoking. If you need help quitting, ask your health care provider.  Avoid caffeine.  Avoid alcohol.  Rest as needed. SEEK MEDICAL CARE IF:   You have new symptoms.  You cough up pus.  Your cough does not get better after 2-3 weeks, or your cough gets worse.  You cannot control your cough with suppressant medicines and you are losing sleep.  You develop pain that is getting worse or pain that is not controlled with pain medicines.  You have a fever.  You have unexplained weight loss.  You have night sweats. SEEK IMMEDIATE MEDICAL CARE IF:  You cough up blood.  You have difficulty breathing.  Your heartbeat is very fast.   This information is not intended to replace advice given to you by your health care provider. Make sure you discuss any  questions you have with your health care provider.   Document Released: 06/20/2010 Document Revised: 09/12/2014 Document Reviewed: 02/28/2014 Elsevier Interactive Patient Education 2016 [employer].    IF you received an x-ray today, you will receive an invoice from [facility]. Please contact [facility] at [phone] with questions or concerns regarding your invoice.   IF you received labwork today, you will receive an invoice from [employer]. Please contact [facility] at [phone] with questions or concerns regarding your invoice.   Our billing staff will not be able to assist you with questions regarding bills from these companies.  You will be contacted with the lab results as soon as they are available. The fastest way to get your results is to activate your My Chart account. Instructions are located on the last page of this paperwork. If you have not heard from [place] regarding the results in 2 weeks, please contact this office.

## 2015-11-23 IMAGING — CT CT ABD-PELV W/ CM
2 of 4 series · 15 of 46 positions shown, 17 images · IV contrast (OMNIPAQUE 300)
Comparison: July 12, 2012

CLINICAL DATA: Emesis for 1 month.

EXAM:
CT ABDOMEN AND PELVIS WITH CONTRAST
TECHNIQUE: Multidetector CT imaging of the abdomen and pelvis was performed
using the standard protocol following bolus administration of
intravenous contrast. Oral contrast was also administered.
CONTRAST:  100mL OMNIPAQUE IOHEXOL 300 MG/ML  SOLN

[Series 2: abd/pel with · axial · 0.71mm/px · z∈[-742,-326]mm · 12 of 91 slices shown, 14 images]
[im 4/91  soft-tissue]
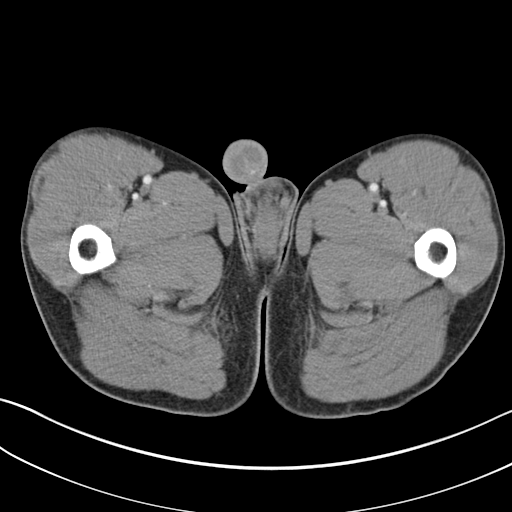
[im 4/91  bone]
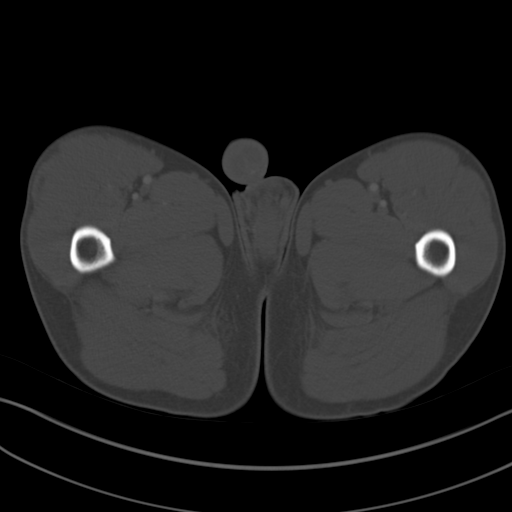
[im 12/91  soft-tissue]
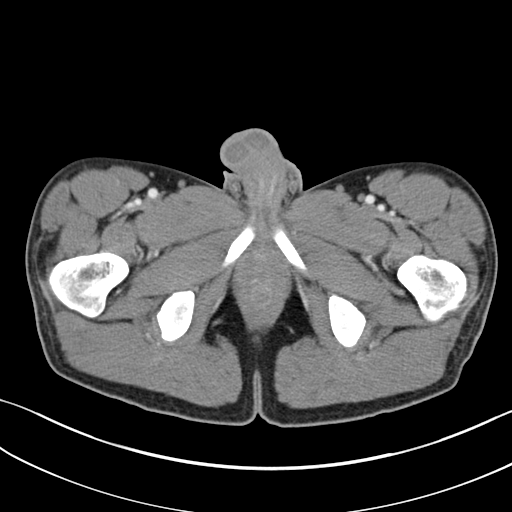
[im 19/91  soft-tissue]
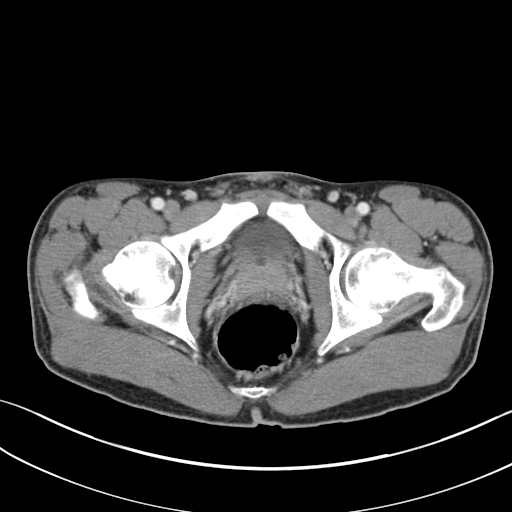
[im 27/91  soft-tissue]
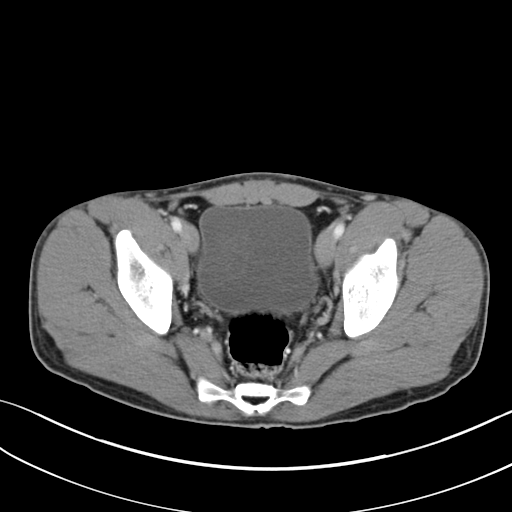
[im 34/91  soft-tissue]
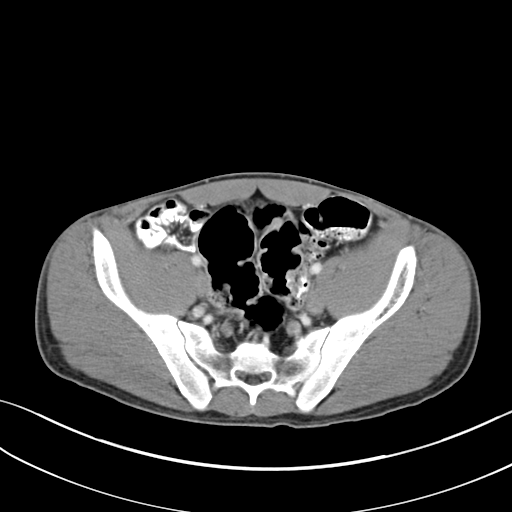
[im 42/91  soft-tissue]
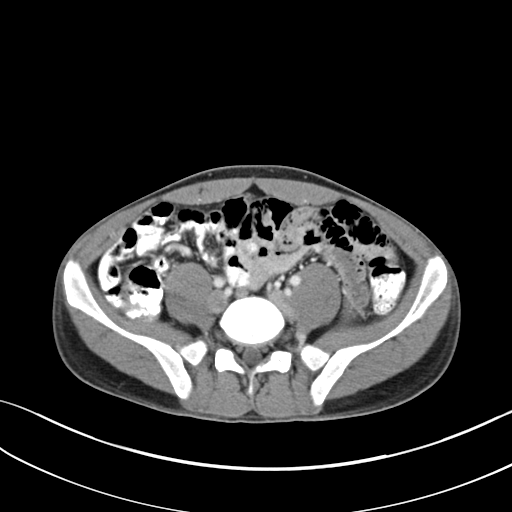
[im 49/91  soft-tissue]
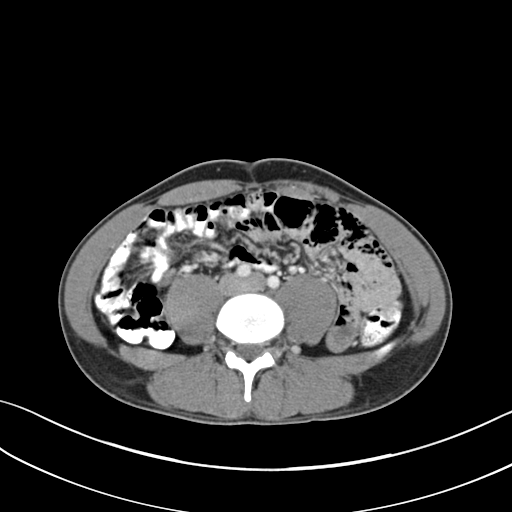
[im 57/91  soft-tissue]
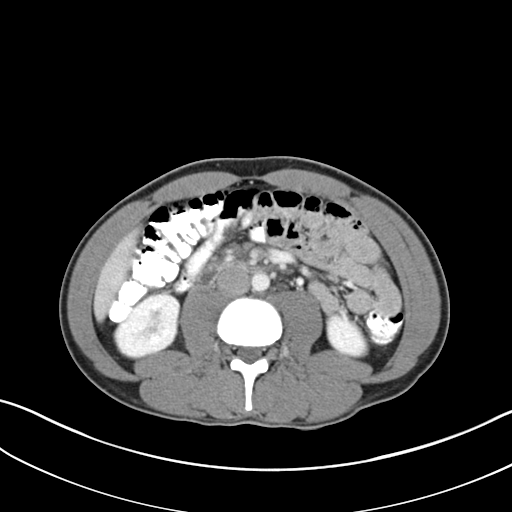
[im 64/91  soft-tissue]
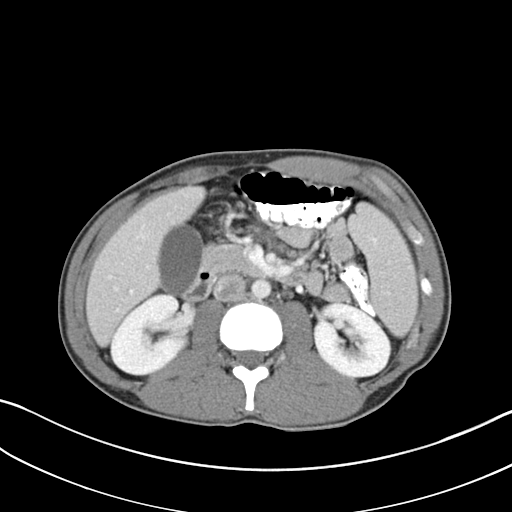
[im 64/91  bone]
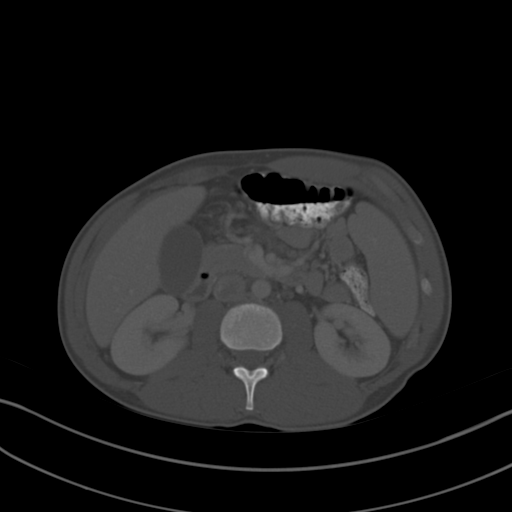
[im 72/91  soft-tissue]
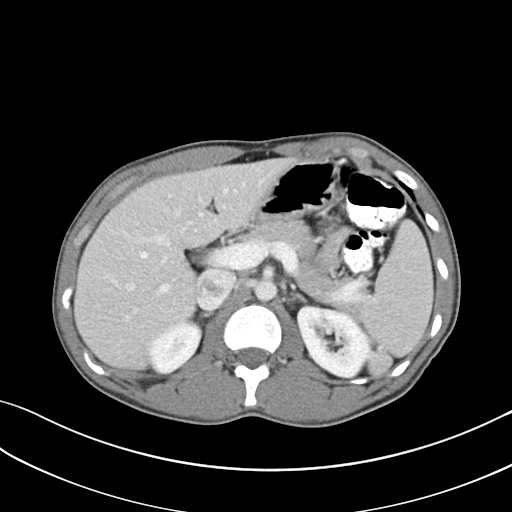
[im 79/91  soft-tissue]
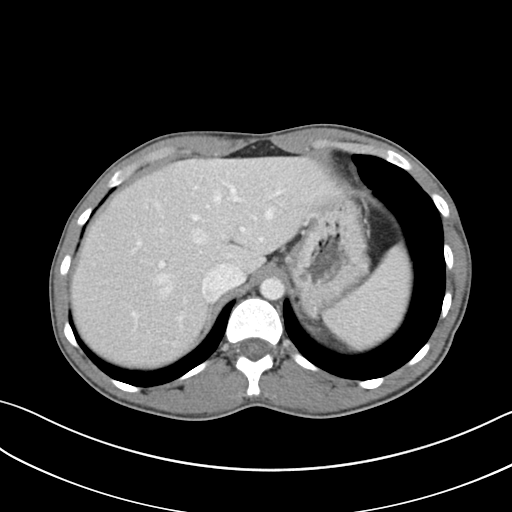
[im 87/91  soft-tissue]
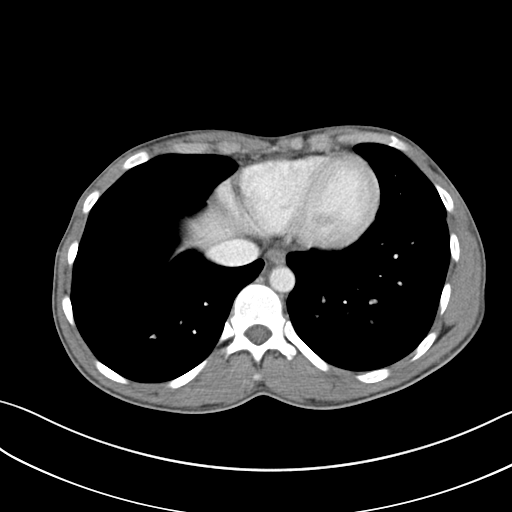

[Series 3: coronal a/|p · coronal · 0.59mm/px · 3 of 75 slices shown]
[im 25/75  soft-tissue]
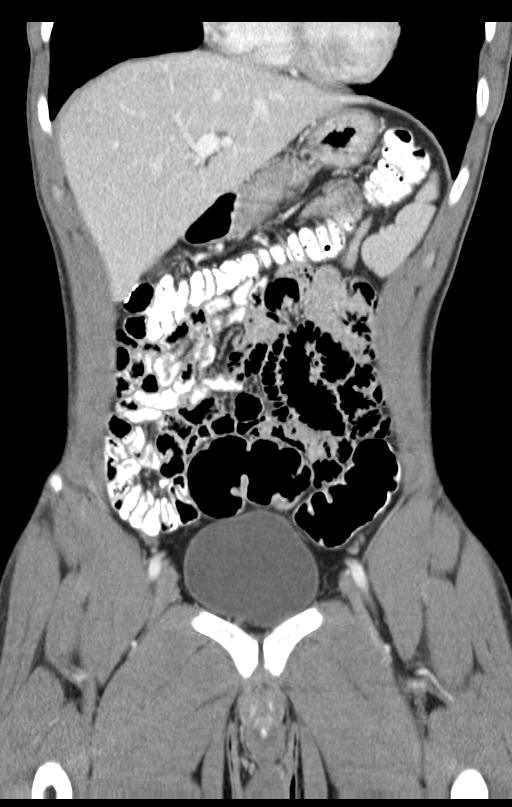
[im 33/75  soft-tissue]
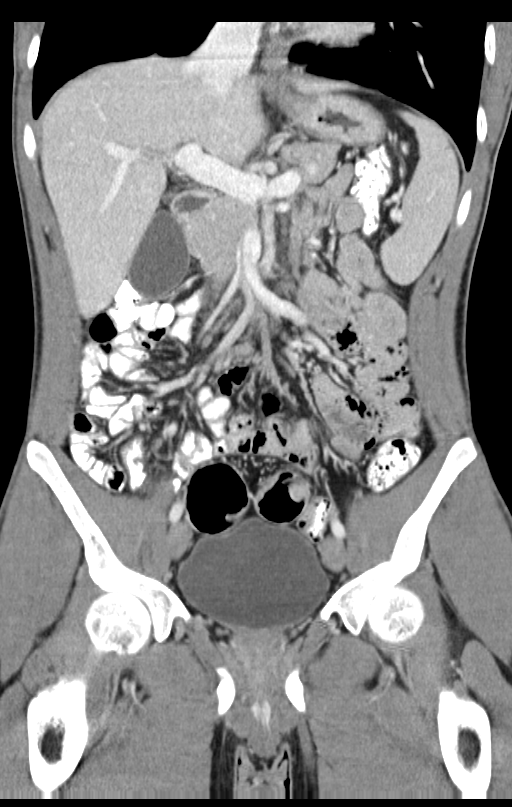
[im 42/75  soft-tissue]
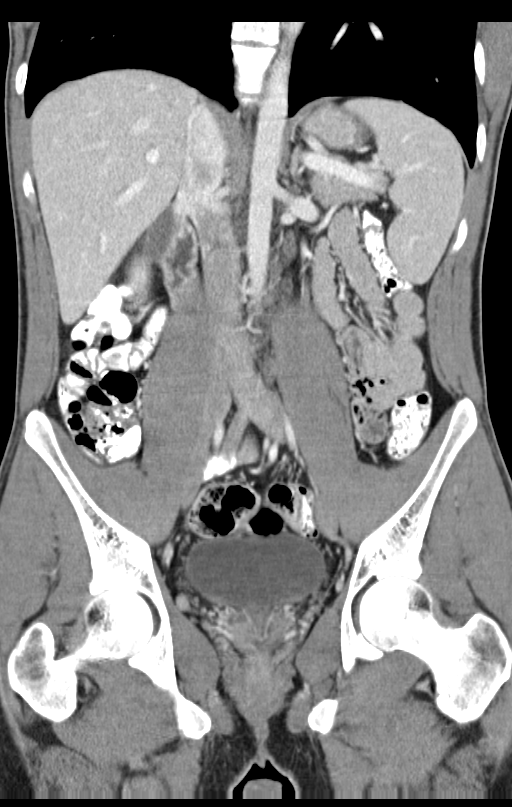

[15 of 46 positions shown; findings below may reference images not displayed]

FINDINGS: Lower chest:  Lung bases are clear.

Hepatobiliary: There is a 4 mm cyst in the right lobe of the liver
slightly inferior to the caudate lobe. No other focal liver lesions
are identified. Gallbladder wall is not appreciably thickened. There
is no biliary duct dilatation.

Pancreas: No mass or inflammatory focus.

Spleen: No splenic lesions are appreciable. Previously noted
splenomegaly is no longer apparent.

Adrenals/Urinary Tract: Adrenals appear normal bilaterally. Kidneys
bilaterally show no mass or hydronephrosis on either side. There is
no renal or ureteral calculus on either side. The urinary bladder is
midline with wall thickness within normal limits.

Stomach/Bowel: There is no bowel wall or mesenteric thickening.
There is no bowel obstruction. No free air or portal venous air.

Vascular/Lymphatic: There is no appreciable abdominal aortic
aneurysm. No vascular lesions are appreciable. There is no
demonstrable adenopathy in the abdomen or pelvis.

Reproductive: Prostate is normal in size and contour. There is no
pelvic mass.

Other: Appendix appears normal. No abscess or ascites in the abdomen
or pelvis.

Musculoskeletal: There are no blastic or lytic bone lesions. A small
bone island is noted in the right femoral neck incidentally. No
intramuscular or abdominal wall lesions are appreciable.
IMPRESSION: A cause for patient's symptoms has not been established with this
study. No bowel wall thickening or bowel obstruction. No abscess.
Appendix appears normal.

No renal or ureteral calculus.  No hydronephrosis.  No adenopathy.

## 2017-11-14 ENCOUNTER — Emergency Department (HOSPITAL_BASED_OUTPATIENT_CLINIC_OR_DEPARTMENT_OTHER)
Admission: EM | Admit: 2017-11-14 | Discharge: 2017-11-15 | Disposition: A | Discharge status: Home / Self Care | Payer: PPO | Attending: Emergency Medicine | Admitting: Emergency Medicine

## 2017-11-14 DIAGNOSIS — K219 Gastro-esophageal reflux disease without esophagitis: Secondary | ICD-10-CM | POA: Diagnosis not present

## 2017-11-14 DIAGNOSIS — R1110 Vomiting, unspecified: Secondary | ICD-10-CM | POA: Diagnosis present

## 2017-11-14 DIAGNOSIS — F17210 Nicotine dependence, cigarettes, uncomplicated: Secondary | ICD-10-CM | POA: Diagnosis not present

## 2017-11-14 DIAGNOSIS — E860 Dehydration: Secondary | ICD-10-CM | POA: Diagnosis not present

## 2017-11-14 LAB — CBC WITH DIFFERENTIAL/PLATELET
ABS IMMATURE GRANULOCYTES: 0.01 10*3/uL (ref 0.00–0.07)
Basophils Absolute: 0 10*3/uL (ref 0.0–0.1)
Basophils Relative: 1 %
Eosinophils Absolute: 0.1 10*3/uL (ref 0.0–0.5)
Eosinophils Relative: 1 %
HEMATOCRIT: 48.9 % (ref 39.0–52.0)
HEMOGLOBIN: 16.7 g/dL (ref 13.0–17.0)
Immature Granulocytes: 0 %
Lymphocytes Relative: 32 %
Lymphs Abs: 1.3 10*3/uL (ref 0.7–4.0)
MCH: 29.9 pg (ref 26.0–34.0)
MCHC: 34.2 g/dL (ref 30.0–36.0)
MCV: 87.5 fL (ref 80.0–100.0)
MONO ABS: 0.4 10*3/uL (ref 0.1–1.0)
MONOS PCT: 10 %
Neutro Abs: 2.3 10*3/uL (ref 1.7–7.7)
Neutrophils Relative %: 56 %
PLATELETS: 264 10*3/uL (ref 150–400)
RBC: 5.59 MIL/uL (ref 4.22–5.81)
RDW: 12.2 % (ref 11.5–15.5)
WBC: 4.1 10*3/uL (ref 4.0–10.5)
nRBC: 0 % (ref 0.0–0.2)

## 2017-11-14 LAB — COMPREHENSIVE METABOLIC PANEL
ALBUMIN: 4.8 g/dL (ref 3.5–5.0)
ALT: 28 U/L (ref 0–44)
AST: 19 U/L (ref 15–41)
Alkaline Phosphatase: 61 U/L (ref 38–126)
Anion gap: 12 (ref 5–15)
BILIRUBIN TOTAL: 1.3 mg/dL — AB (ref 0.3–1.2)
BUN: 11 mg/dL (ref 6–20)
CHLORIDE: 101 mmol/L (ref 98–111)
CO2: 23 mmol/L (ref 22–32)
Calcium: 9.8 mg/dL (ref 8.9–10.3)
Creatinine, Ser: 0.6 mg/dL — ABNORMAL LOW (ref 0.61–1.24)
GFR calc Af Amer: >60 mL/min (ref >60)
GFR calc non Af Amer: >60 mL/min (ref >60)
GLUCOSE: 108 mg/dL — AB (ref 70–99)
POTASSIUM: 3.2 mmol/L — AB (ref 3.5–5.1)
Sodium: 136 mmol/L (ref 135–145)
Total Protein: 7.7 g/dL (ref 6.5–8.1)

## 2017-11-14 LAB — I-STAT CG4 LACTIC ACID, ED: Lactic Acid, Venous: 0.84 mmol/L (ref 0.5–1.9)

## 2017-11-14 LAB — LIPASE, BLOOD: Lipase: 22 U/L (ref 11–51)

## 2017-11-14 MED ORDER — FAMOTIDINE 20 MG PO TABS: 20.0000 mg | ORAL_TABLET | Freq: Two times a day (BID) | ORAL | 0 refills | Status: AC

## 2017-11-14 MED ORDER — LACTATED RINGERS IV BOLUS
1000.0000 mL | Freq: Once | INTRAVENOUS | Status: AC
  Administered 2017-11-14: 1000 mL via INTRAVENOUS

## 2017-11-14 MED ORDER — FAMOTIDINE IN NACL 20-0.9 MG/50ML-% IV SOLN
20.0000 mg | Freq: Once | INTRAVENOUS | Status: AC
  Administered 2017-11-14: 20 mg via INTRAVENOUS
  Filled 2017-11-14: qty 50

## 2017-11-14 NOTE — ED Notes (Signed)
ED Provider at bedside. 

## 2017-11-14 NOTE — ED Triage Notes (Signed)
Pt BIB [facility] for complaint of foot pain. Patient states it started today and feel like he cannot move them. Pt has feeling and movement in feet. He also complains of the light bothering his eyes but denies headache. Per report he has had diminished appetite over the past two days and vomited once PTA of EMS. There was additional report of vomiting in the days past

## 2017-11-14 NOTE — ED Provider Notes (Signed)
[facility] EMERGENCY DEPARTMENT Provider Note   CSN: [id number] Arrival date & time: 11/14/17  2149     History   Chief Complaint Chief Complaint  Patient presents with  . Foot Pain  . Emesis    HPI Calvin Wolf is a 27 y.o. male.  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  His friend gives all of the history and patient does not participate with the history.  His friend states that over the last few days he has not been himself.  He has had anorexia and did not have any vomiting until today.  He has been smoking a lot of cigarettes and not eating.  Today when they were at [employer] he tried to eat and got choked up on some food and then started complaining of pain in his legs, feet, head and arms.  Currently here he will not answer any questions or follow commands.  He takes no medications.  He has had no specific weight loss.  He denies any urinary symptoms or diarrhea.  His friend states that often times when he wakes up in the morning he has to wait for several hours before he tries to eat or he will vomit.  He does not see a doctor regularly.  The history is provided by a friend.  Emesis      Past Medical History:  Diagnosis Date  . Anemia age 27   Pt states "I had before anemia"    Patient Active Problem List   Diagnosis Date Noted  . 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 Neutropenic fever (HCC) 07/11/2012  . Lump in neck 07/11/2012  . Immigrant with language difficulty 07/11/2012    Past Surgical History:  Procedure Laterality Date  . NO PAST SURGERIES          Home Medications    Prior to Admission medications   Medication Sig Start Date End Date Taking? Authorizing Provider    azithromycin (ZITHROMAX) 250 MG tablet Start with 2 tablets today, then 1 daily thereafter. 04/03/15   Wallis Bamberg, PA-C  benzonatate (TESSALON) 100 MG capsule Take 1-2 capsules (100-200 mg total) by mouth 3 (three) times daily as needed for cough. 04/03/15   Wallis Bamberg, PA-C  cetirizine (ZYRTEC) 10 MG tablet Take 1 tablet (10 mg total) by mouth daily. 04/03/15   Wallis Bamberg, PA-C  HYDROcodone-homatropine [facility]) 5-1.5 MG/5ML syrup Take 5 mLs by mouth at bedtime as needed. 04/03/15   Wallis Bamberg, PA-C  oseltamivir (TAMIFLU) 75 MG capsule Take 1 capsule (75 mg total) by mouth 2 (two) times daily. 04/03/15   Wallis Bamberg, PA-C    Family History Family History  Problem Relation Age of Onset  . Diabetes Father     Social History Social History   Tobacco Use  . Smoking status: Current Every Day Smoker    Packs/day: 1.00    Years: 4.00    Pack years: 4.00  . Smokeless tobacco: Never Used  Substance Use Topics  . Alcohol use: Yes    Alcohol/week: 0.0 standard drinks    Comment: "a little bit"  last time was in June 2014  . Drug use: No    Comment: hx of hookh smoking prior to cigarettes per pt  Allergies   Patient has no known allergies.   Review of Systems Review of Systems  Gastrointestinal: Positive for vomiting.  All other systems reviewed and are negative.    Physical Exam Updated Vital Signs BP 120/79   Pulse 60   Ht 5\' 6"  (1.676 m)   Wt 52.2 kg   SpO2 100%   BMI 18.56 kg/m   Physical Exam  Constitutional: He appears well-developed and well-nourished. He is sleeping. No distress.  HENT:  Head: Normocephalic and atraumatic.  Mouth/Throat: Oropharynx is clear and moist.  Eyes: Pupils are equal, round, and reactive to light. Conjunctivae and EOM are normal.  Neck: Normal range of motion. Neck supple.  Cardiovascular: Normal rate, regular rhythm and intact distal pulses.  No murmur heard. Pulmonary/Chest: Effort normal and breath sounds normal. No respiratory  distress. He has no wheezes. He has no rales.  Abdominal: Soft. He exhibits no distension. There is no tenderness. There is no rebound and no guarding.  Musculoskeletal: Normal range of motion. He exhibits no edema or tenderness.  Neurological:  Patient is sleeping but is arousable.  However he will not answer questions but will occasionally speak in Arabic to his friend and his friend denies him being confused.  He initially states he cannot lift his arms or legs however after picking up his arm and telling him repeatedly that he needs to hold it up he was able to hold up both of his arms and had normal sensation.  He is able to move his feet without difficulty.  He is not participating in the exam.  Skin: Skin is warm and dry. No rash noted. No erythema.  Psychiatric:  Not cooperative  Nursing note and vitals reviewed.    ED Treatments / Results  Labs (all labs ordered are listed, but only abnormal results are displayed) Labs Reviewed  COMPREHENSIVE METABOLIC PANEL - Abnormal; Notable for the following components:      Result Value   Potassium 3.2 (*)    Glucose, Bld 108 (*)    Creatinine, Ser 0.60 (*)    Total Bilirubin 1.3 (*)    All other components within normal limits  CBC WITH DIFFERENTIAL/PLATELET  LIPASE, BLOOD  URINALYSIS, ROUTINE W REFLEX MICROSCOPIC  I-STAT CG4 LACTIC ACID, ED    EKG None  Radiology No results found.  Procedures Procedures (including critical care time)  Medications Ordered in ED Medications  lactated ringers bolus 1,000 mL (1,000 mLs Intravenous New Bag/Given 11/14/17 2239)  famotidine (PEPCID) IVPB 20 mg premix (20 mg Intravenous New Bag/Given 11/14/17 2245)     Initial Impression / Assessment and Plan / ED Course  I have reviewed the triage vital signs and the nursing notes.  Pertinent labs & imaging results that were available during my care of the patient were reviewed by me and considered in my medical decision making (see chart for  details).     Patient presenting with a prior history of Kikuchi disease and chronic leukopenia with 2 days of anorexia, smoking excessive amount of cigarettes and not eating anything.  Here patient refuses to cooperate but has no focal findings on exam.  Low suspicion for neurologic cause.  In the past he has had swollen lymph nodes but has no lymphadenopathy today.  He is afebrile today.  Leukopenia has improved and now white count is 4.8.  No significant abnormalities with LFTs or electrolytes.  Patient's lactate is within normal limits.  Based on his friends description concern for possible GERD or  peptic ulcer disease.  No evidence of hepatitis, pancreatitis and low suspicion for appendicitis.  Patient was hospitalized 4 years ago and at that time was tested for HIV, multiple infectious etiologies, [employer] spotted fever and numerous other things that all came back negative. Labs are reassuring today.  Patient given IV fluid and Pepcid.  Vital signs are normal.  11:38 PM  Pt now awake and talking and states he feels better.  Will d/c home with pepcid and have f/u with pcp.  Final Clinical Impressions(s) / ED Diagnoses   Final diagnoses:  Dehydration  Gastroesophageal reflux disease without esophagitis    ED Discharge Orders         Ordered    famotidine (PEPCID) 20 MG tablet  2 times daily     11/14/17 2340           Gwyneth Sprout, MD 11/14/17 2340

## 2017-11-14 NOTE — ED Notes (Signed)
Pt and family member was notified that we need a urine sample when he can give [place] one

## 2017-11-15 NOTE — ED Notes (Signed)
Pt and family understood dc material. NAD noted. Script given at dc
